# Patient Record
Sex: Male | Born: 1935 | Race: Black or African American | Hispanic: No | Marital: Married | Smoking: Former smoker
Health system: Southern US, Community
[De-identification: ages and names within clinical notes are randomized; demographics above are authoritative.]

## PROBLEM LIST (undated history)

## (undated) DIAGNOSIS — E119 Type 2 diabetes mellitus without complications: Secondary | ICD-10-CM

## (undated) DIAGNOSIS — B351 Tinea unguium: Secondary | ICD-10-CM

## (undated) DIAGNOSIS — I10 Essential (primary) hypertension: Secondary | ICD-10-CM

## (undated) HISTORY — PX: LUNG BIOPSY: SHX232

## (undated) HISTORY — DX: Essential (primary) hypertension: I10

## (undated) HISTORY — DX: Type 2 diabetes mellitus without complications: E11.9

## (undated) HISTORY — PX: PARTIAL HIP ARTHROPLASTY: SHX733

## (undated) HISTORY — DX: Tinea unguium: B35.1

---

## 1999-07-04 ENCOUNTER — Encounter: Admission: RE | Admit: 1999-07-04 | Discharge: 1999-10-02 | Payer: Self-pay | Admitting: Internal Medicine

## 2000-07-15 ENCOUNTER — Encounter (INDEPENDENT_AMBULATORY_CARE_PROVIDER_SITE_OTHER): Payer: Self-pay | Admitting: *Deleted

## 2000-07-15 ENCOUNTER — Ambulatory Visit (HOSPITAL_COMMUNITY): Admission: RE | Admit: 2000-07-15 | Discharge: 2000-07-15 | Payer: Self-pay | Admitting: Gastroenterology

## 2000-10-08 ENCOUNTER — Encounter: Payer: Self-pay | Admitting: Ophthalmology

## 2000-10-13 ENCOUNTER — Ambulatory Visit (HOSPITAL_COMMUNITY): Admission: RE | Admit: 2000-10-13 | Discharge: 2000-10-13 | Payer: Self-pay | Admitting: Ophthalmology

## 2000-10-15 ENCOUNTER — Ambulatory Visit (HOSPITAL_COMMUNITY): Admission: RE | Admit: 2000-10-15 | Discharge: 2000-10-15 | Payer: Self-pay | Admitting: Internal Medicine

## 2000-10-15 ENCOUNTER — Encounter: Payer: Self-pay | Admitting: Internal Medicine

## 2001-06-23 ENCOUNTER — Encounter: Payer: Self-pay | Admitting: Internal Medicine

## 2001-06-23 ENCOUNTER — Ambulatory Visit (HOSPITAL_COMMUNITY): Admission: RE | Admit: 2001-06-23 | Discharge: 2001-06-23 | Payer: Self-pay | Admitting: Internal Medicine

## 2001-07-13 ENCOUNTER — Encounter: Payer: Self-pay | Admitting: Thoracic Surgery

## 2001-07-13 ENCOUNTER — Encounter (INDEPENDENT_AMBULATORY_CARE_PROVIDER_SITE_OTHER): Payer: Self-pay | Admitting: Specialist

## 2001-07-13 ENCOUNTER — Inpatient Hospital Stay (HOSPITAL_COMMUNITY): Admission: RE | Admit: 2001-07-13 | Discharge: 2001-07-17 | Payer: Self-pay | Admitting: Thoracic Surgery

## 2001-07-14 ENCOUNTER — Encounter: Payer: Self-pay | Admitting: Thoracic Surgery

## 2001-07-15 ENCOUNTER — Encounter: Payer: Self-pay | Admitting: Thoracic Surgery

## 2001-07-16 ENCOUNTER — Encounter: Payer: Self-pay | Admitting: Thoracic Surgery

## 2001-07-17 ENCOUNTER — Encounter: Payer: Self-pay | Admitting: Thoracic Surgery

## 2001-07-22 ENCOUNTER — Encounter: Admission: RE | Admit: 2001-07-22 | Discharge: 2001-07-22 | Payer: Self-pay | Admitting: Thoracic Surgery

## 2001-07-22 ENCOUNTER — Encounter: Payer: Self-pay | Admitting: Thoracic Surgery

## 2001-08-24 ENCOUNTER — Encounter: Admission: RE | Admit: 2001-08-24 | Discharge: 2001-08-24 | Payer: Self-pay | Admitting: Thoracic Surgery

## 2001-08-24 ENCOUNTER — Encounter: Payer: Self-pay | Admitting: Thoracic Surgery

## 2001-10-05 ENCOUNTER — Encounter: Payer: Self-pay | Admitting: Thoracic Surgery

## 2001-10-05 ENCOUNTER — Encounter: Admission: RE | Admit: 2001-10-05 | Discharge: 2001-10-05 | Payer: Self-pay | Admitting: Thoracic Surgery

## 2001-12-06 ENCOUNTER — Encounter: Admission: RE | Admit: 2001-12-06 | Discharge: 2001-12-06 | Payer: Self-pay | Admitting: Thoracic Surgery

## 2001-12-06 ENCOUNTER — Encounter: Payer: Self-pay | Admitting: Thoracic Surgery

## 2002-03-07 ENCOUNTER — Encounter: Payer: Self-pay | Admitting: Thoracic Surgery

## 2002-03-07 ENCOUNTER — Encounter: Admission: RE | Admit: 2002-03-07 | Discharge: 2002-03-07 | Payer: Self-pay | Admitting: Thoracic Surgery

## 2002-07-06 ENCOUNTER — Encounter: Payer: Self-pay | Admitting: Thoracic Surgery

## 2002-07-06 ENCOUNTER — Encounter: Admission: RE | Admit: 2002-07-06 | Discharge: 2002-07-06 | Payer: Self-pay | Admitting: Thoracic Surgery

## 2002-10-10 ENCOUNTER — Encounter: Admission: RE | Admit: 2002-10-10 | Discharge: 2002-10-10 | Payer: Self-pay | Admitting: Thoracic Surgery

## 2002-10-10 ENCOUNTER — Encounter: Payer: Self-pay | Admitting: Thoracic Surgery

## 2003-04-17 ENCOUNTER — Encounter: Admission: RE | Admit: 2003-04-17 | Discharge: 2003-04-17 | Payer: Self-pay | Admitting: Thoracic Surgery

## 2003-10-17 ENCOUNTER — Encounter: Admission: RE | Admit: 2003-10-17 | Discharge: 2003-10-17 | Payer: Self-pay | Admitting: Thoracic Surgery

## 2004-05-06 ENCOUNTER — Encounter: Admission: RE | Admit: 2004-05-06 | Discharge: 2004-05-06 | Payer: Self-pay | Admitting: Thoracic Surgery

## 2004-12-12 ENCOUNTER — Encounter: Admission: RE | Admit: 2004-12-12 | Discharge: 2004-12-12 | Payer: Self-pay | Admitting: Thoracic Surgery

## 2005-05-13 ENCOUNTER — Ambulatory Visit (HOSPITAL_COMMUNITY): Admission: RE | Admit: 2005-05-13 | Discharge: 2005-05-13 | Payer: Self-pay | Admitting: Gastroenterology

## 2005-05-13 ENCOUNTER — Encounter (INDEPENDENT_AMBULATORY_CARE_PROVIDER_SITE_OTHER): Payer: Self-pay | Admitting: *Deleted

## 2005-06-24 ENCOUNTER — Encounter: Admission: RE | Admit: 2005-06-24 | Discharge: 2005-06-24 | Payer: Self-pay | Admitting: Thoracic Surgery

## 2006-01-06 ENCOUNTER — Encounter: Admission: RE | Admit: 2006-01-06 | Discharge: 2006-01-06 | Payer: Self-pay | Admitting: Thoracic Surgery

## 2007-12-20 ENCOUNTER — Inpatient Hospital Stay (HOSPITAL_COMMUNITY): Admission: RE | Admit: 2007-12-20 | Discharge: 2008-01-02 | Payer: Self-pay | Admitting: Orthopedic Surgery

## 2007-12-20 ENCOUNTER — Ambulatory Visit: Payer: Self-pay | Admitting: Pulmonary Disease

## 2009-11-08 IMAGING — CR DG HIP OPERATIVE*L*
1 series · 1 of 1 positions shown · non-contrast
Comparison: None

CLINICAL DATA: Left total hip arthroplasty.

OPERATIVE LEFT HIP

[view not recorded]
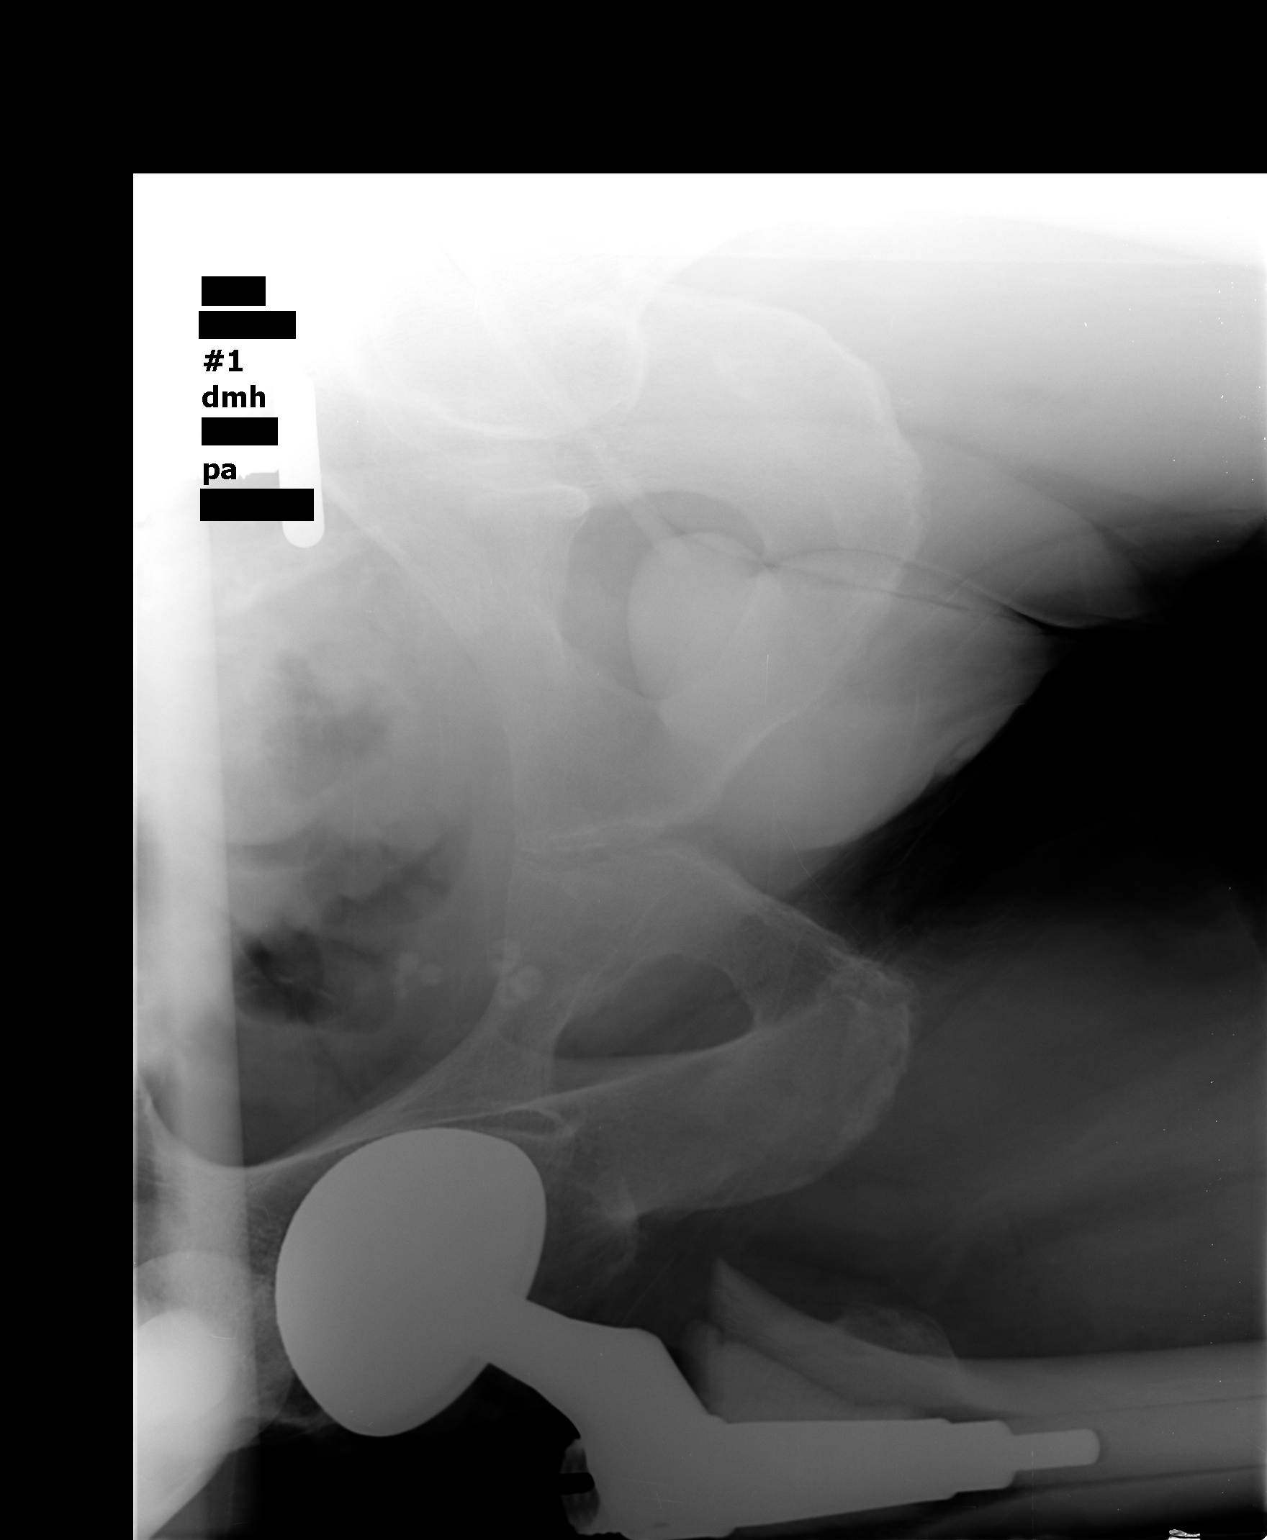

[1 of 1 positions shown; findings below may reference images not displayed]

FINDINGS: Single cross-table PA view of the lower pelvis is
submitted.  Right and left annotations are not included on the
film.  There is a total hip arthroplasty, which is presumably on
the left, given the above history. Lateral portion of the proximal
left femur is not included on the film.
IMPRESSION: Left total hip arthroplasty without immediate complicating feature.

## 2009-11-10 IMAGING — CR DG ABD PORTABLE 1V
1 series · 1 of 1 positions shown · non-contrast
Comparison: None..

CLINICAL DATA: 83-year-old male left hip osteoarthritis, abdominal
distension

ABDOMEN - 1 VIEW

[AP]
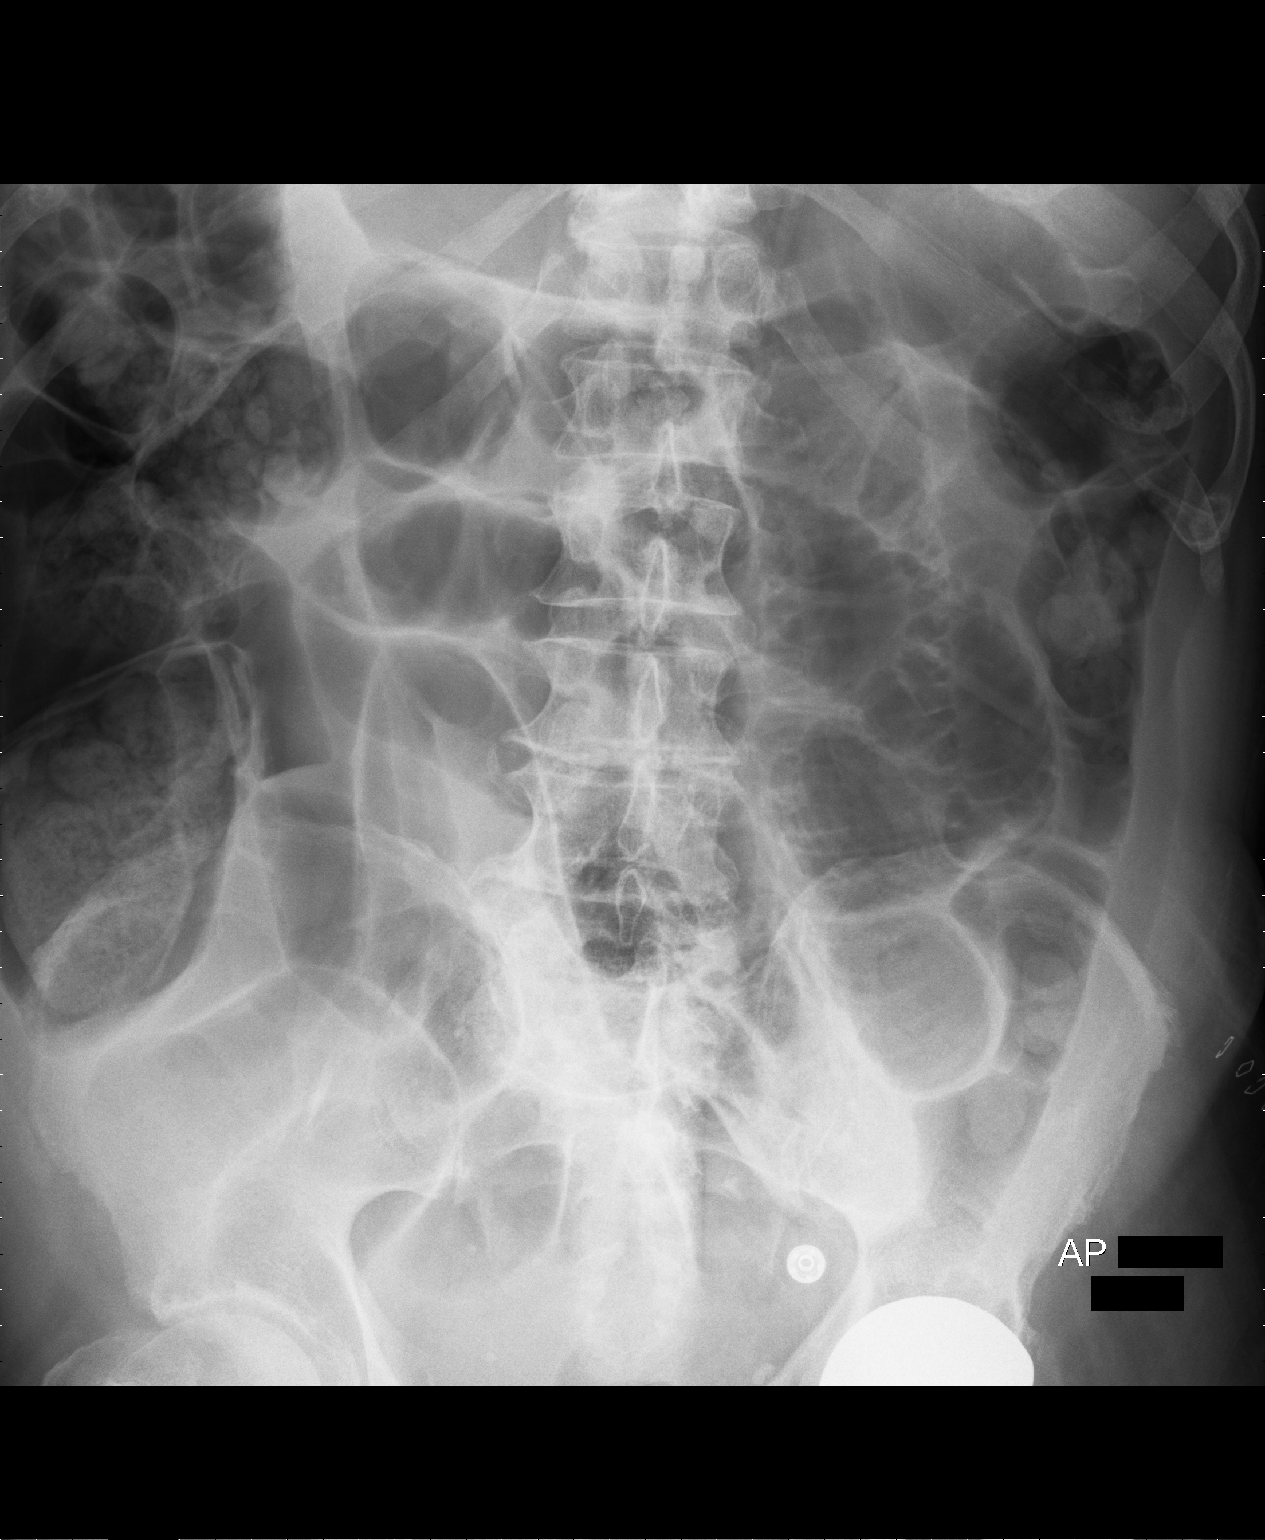

[1 of 1 positions shown; findings below may reference images not displayed]

FINDINGS: There is diffuse gaseous distension of small and large
bowel throughout the entire abdomen consistent with an ileus.  Left
hip arthroplasty is noted.  Pelvic calcifications are likely
vascular]
IMPRESSION: Diffuse gaseous distension of the bowel consistent with an ileus.

## 2009-11-10 IMAGING — CR DG ABD PORTABLE 1V
1 series · 1 of 1 positions shown · non-contrast
Comparison: Single view abdomen of same date.

CLINICAL DATA: Abdominal distension.  Status post left total hip
arthroplasty.

ABDOMEN - 1 VIEW

[view not recorded]
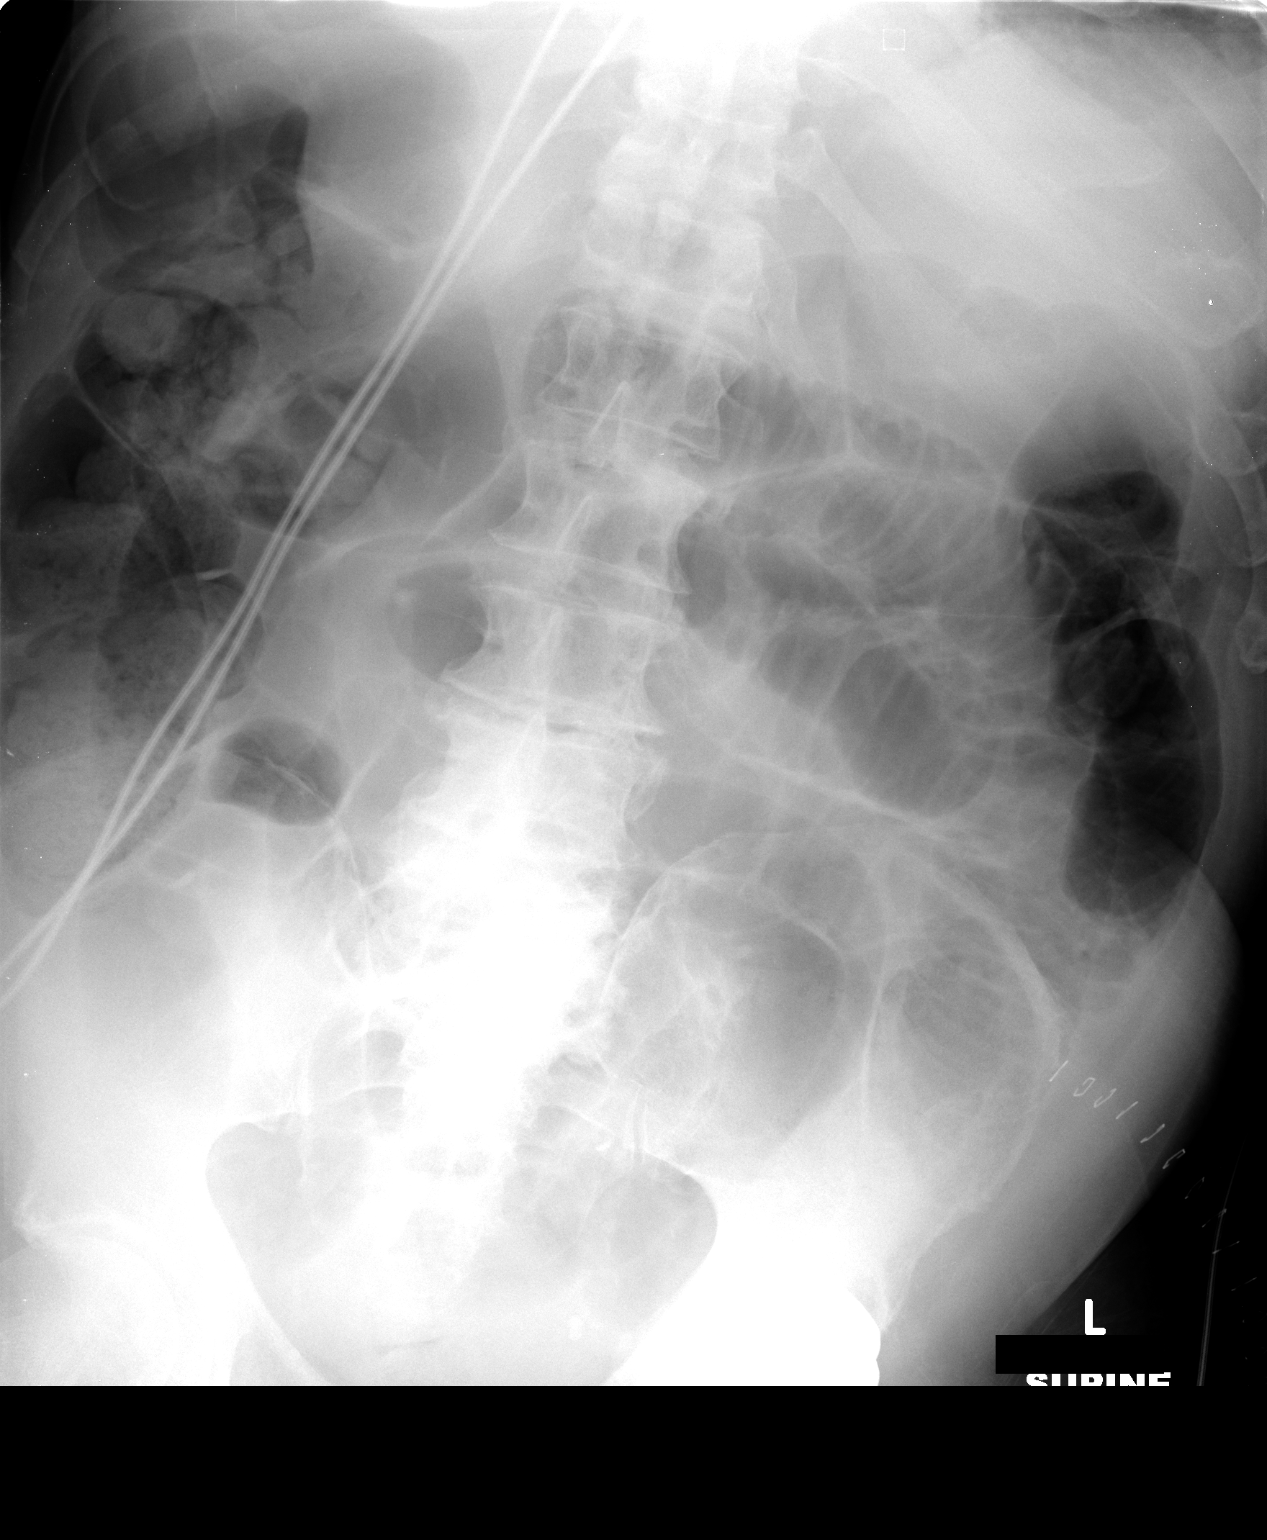

[1 of 1 positions shown; findings below may reference images not displayed]

FINDINGS: Diffuse gaseous distension of the small bowel is
redemonstrated.  There is stool can be seen within the colon.
There is no significant interval change.  Free air is not excluded
on the basis of this film.
IMPRESSION: 1.  Stable appearance of diffuse gaseous distension of small bowel
suggestive of ileus.

## 2009-11-10 IMAGING — US US RENAL
1 series · 13 of 25 positions shown · non-contrast
Comparison: 12/22/2007 conventional radiograph

CLINICAL DATA: Left hip osteoarthritis. Worsening BUN and
creatinine.  Diabetes.  Hypertension.

RENAL/URINARY TRACT ULTRASOUND
TECHNIQUE: Complete ultrasound examination of the urinary tract
was performed including evaluation of the kidneys renal collecting
systems and urinary bladder.

[Series 1: unknown · 0.33mm/px · 13 of 30 slices shown]
[im 1/30]
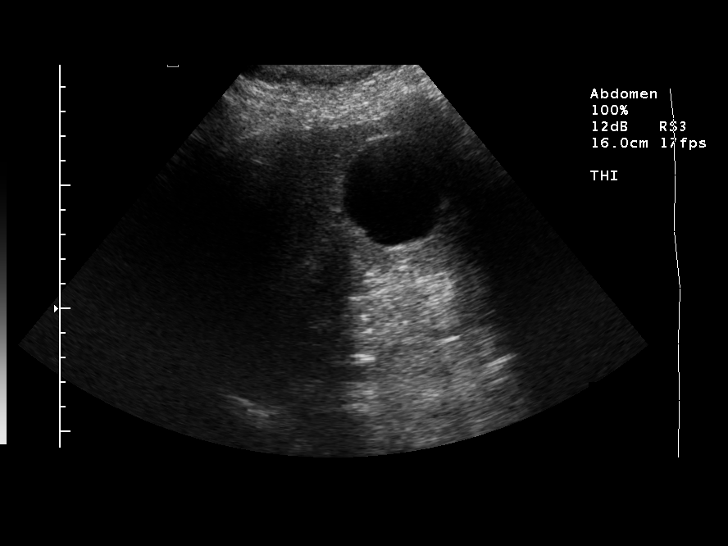
[im 3/30]
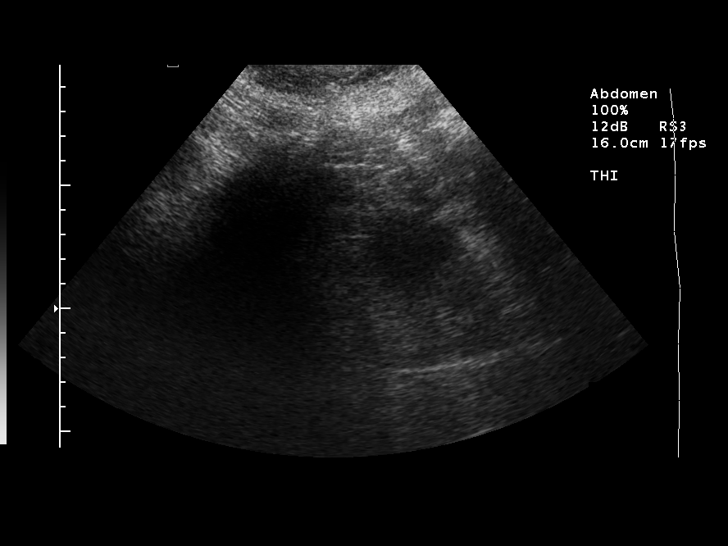
[im 5/30]
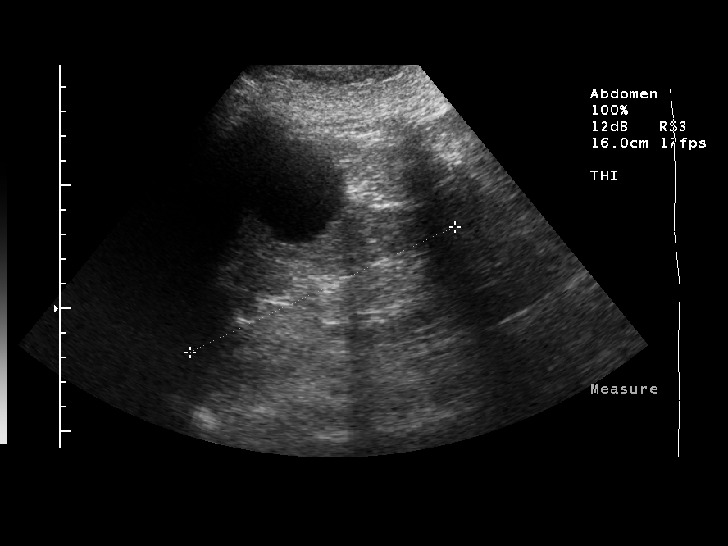
[im 8/30]
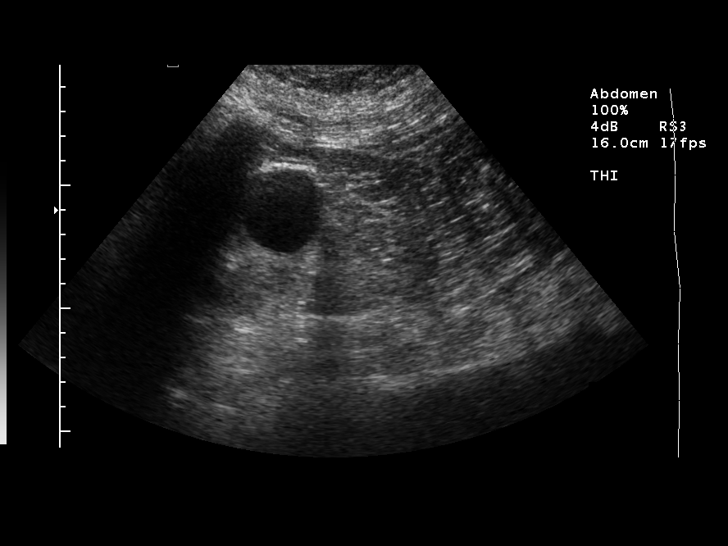
[im 10/30]
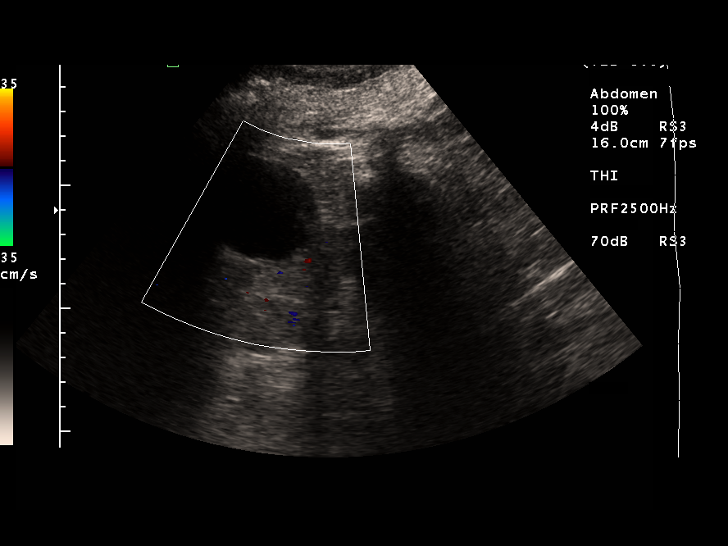
[im 13/30]
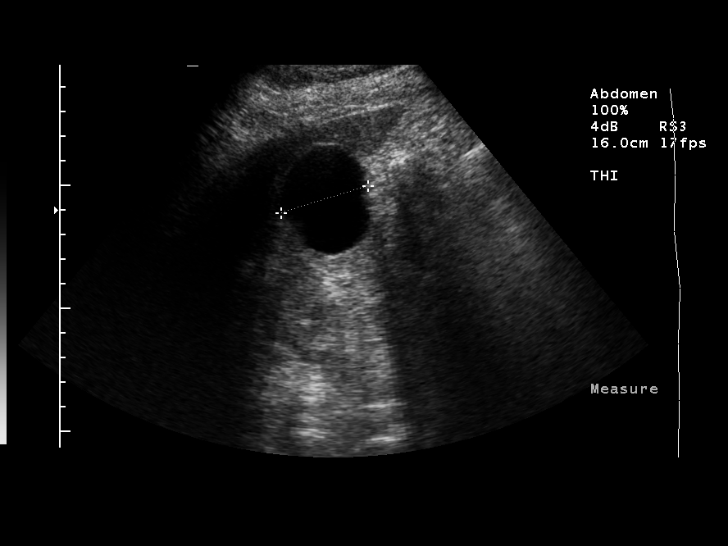
[im 15/30]
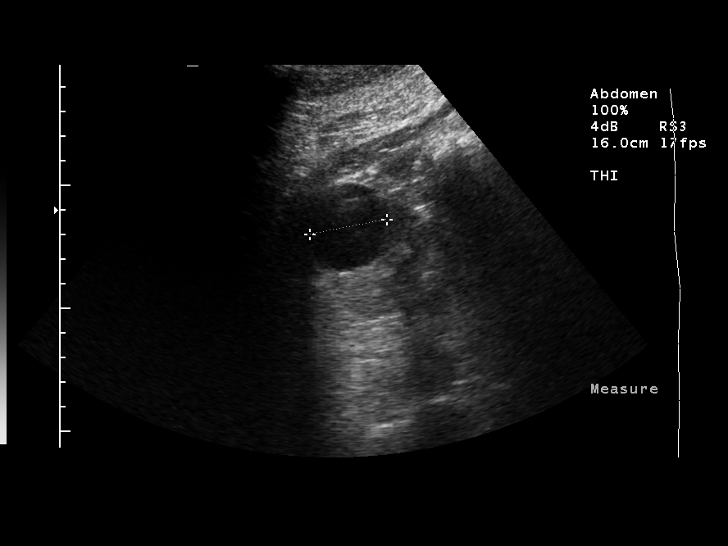
[im 17/30]
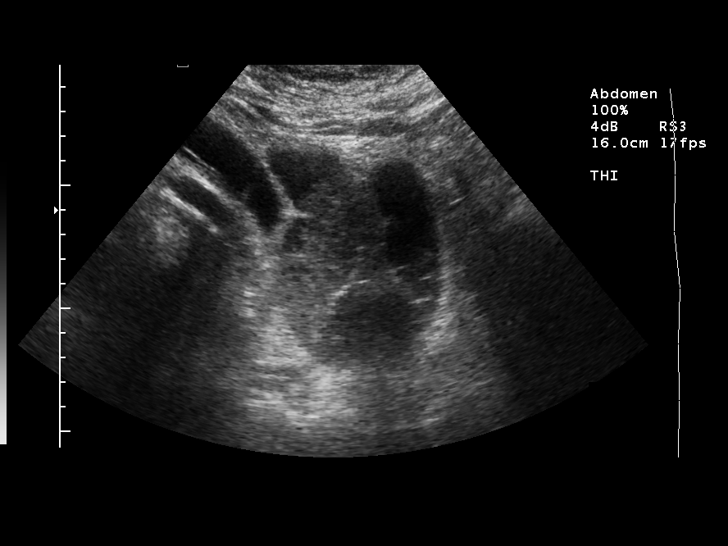
[im 20/30]
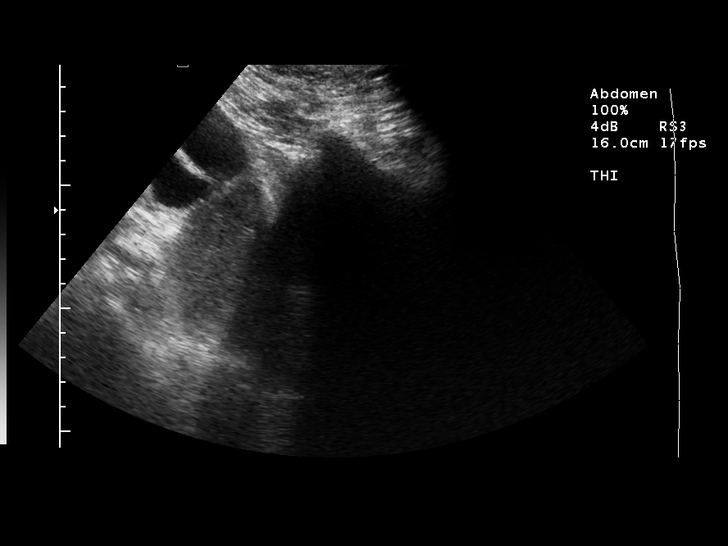
[im 22/30]
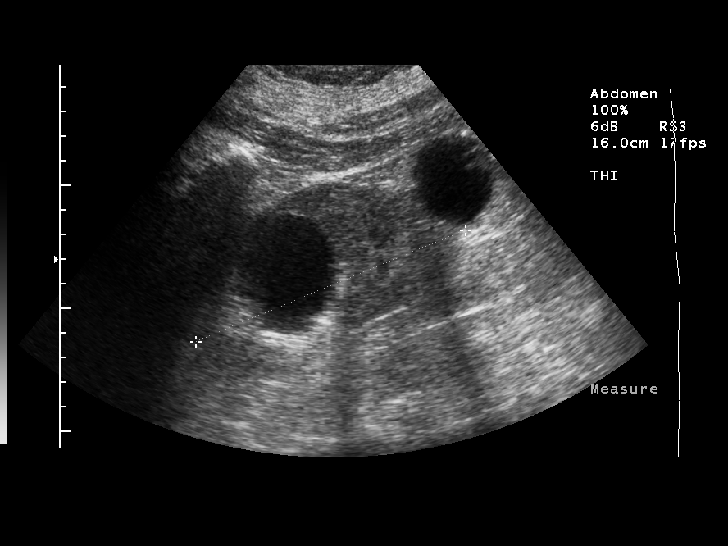
[im 25/30]
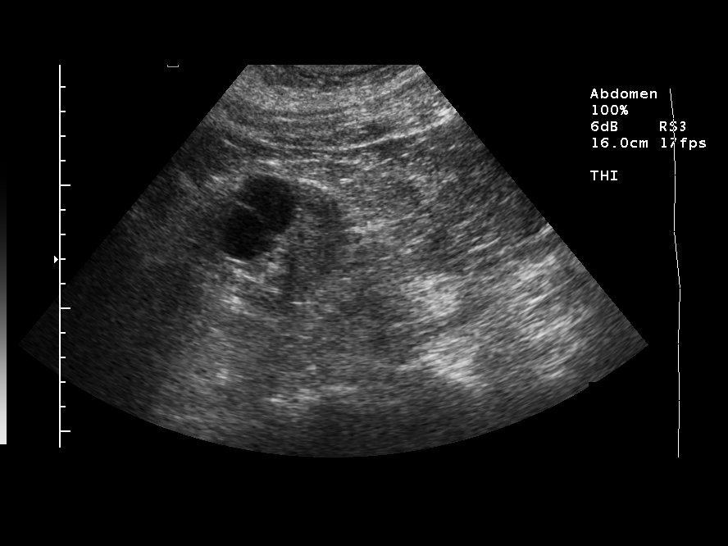
[im 27/30]
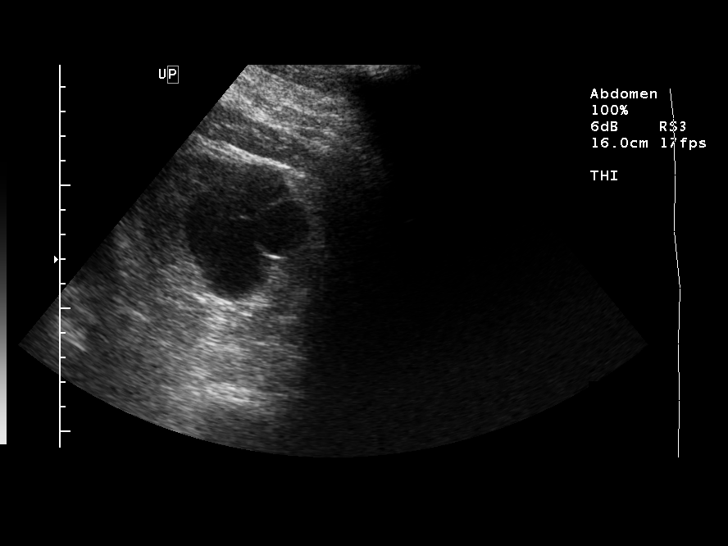
[im 30/30]
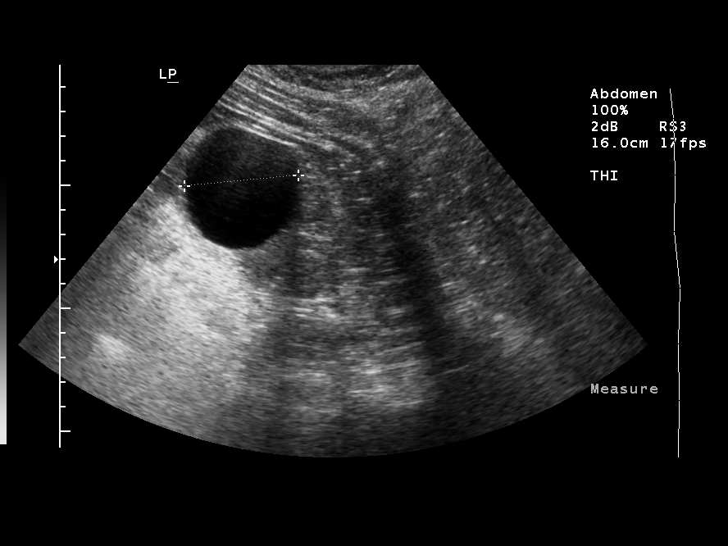

[13 of 25 positions shown; findings below may reference images not displayed]

FINDINGS: Right kidney measures 11.9 cm in greatest length and
contains several renal cysts.  These have enhanced through
transmission and simple imaging characteristics.  One mid kidney
cyst measures 3.6 x 3.6 x 3.2 cm, and an upper pole cyst measures
4.4 x 4.8 x 3.7 cm.  No discrete renal masses identified.

The left kidney measures 11.8 cm in greatest length and also
demonstrates multiple cysts, with the largest measuring 5.0 x 4.6 x
4.6 cm.  This cyst appears to have one to two internal septations
which appears thin sonographically.  On image 21 of today's exam,
there is possible mural nodularity in the left kidney lower pole
cyst, such that further imaging workup is recommended.  Given the
reported renal insufficiency, contrast imaging may be
contraindicated.  This would leave noncontrast CT or MRI as
potential imaging modalities, or alternatively waiting for renal
function to improve prior to contrast imaging.

Renal echogenicity is difficult to assess.  Due to the presence of
considerable bowel gas, images containing the liver and spleen
typically do not also contained the kidneys.  My impression is that
the kidneys are mildly echogenic, possibly reflecting chronic
medical renal disease.

Urinary bladder is not visualized.  There appear to be fluid filled
loops of bowel in the pelvis.
IMPRESSION: 1.  Bilateral renal cysts.  One of the renal cysts on the left side
appears to have some mural nodularity, requiring further workup to
exclude malignancy.  The patient reportedly has increasing
creatinine, and in the setting of renal insufficiency, contrast use
is likely chondral indicated.  Follow-up dynamic MRI after renal
insufficiency resolves would be one option.  Noncontrast CT or MRI,
which would be less specific, is another option.
2.  Nonvisualization the urinary bladder, likely due to small
bladder volume.
3.  The kidneys are thought to be mildly echogenic, although
comparison with the liver and spleen is problematic due to
overlying bowel gas.  The appearance could reflect chronic medical
renal disease.

## 2009-11-11 IMAGING — CR DG CHEST 1V PORT
1 series · 1 of 1 positions shown · non-contrast
Comparison: 12/22/2007.

CLINICAL DATA: Left hip osteoarthritis.  PICC line placement.

PORTABLE CHEST - 1 VIEW

[view not recorded]
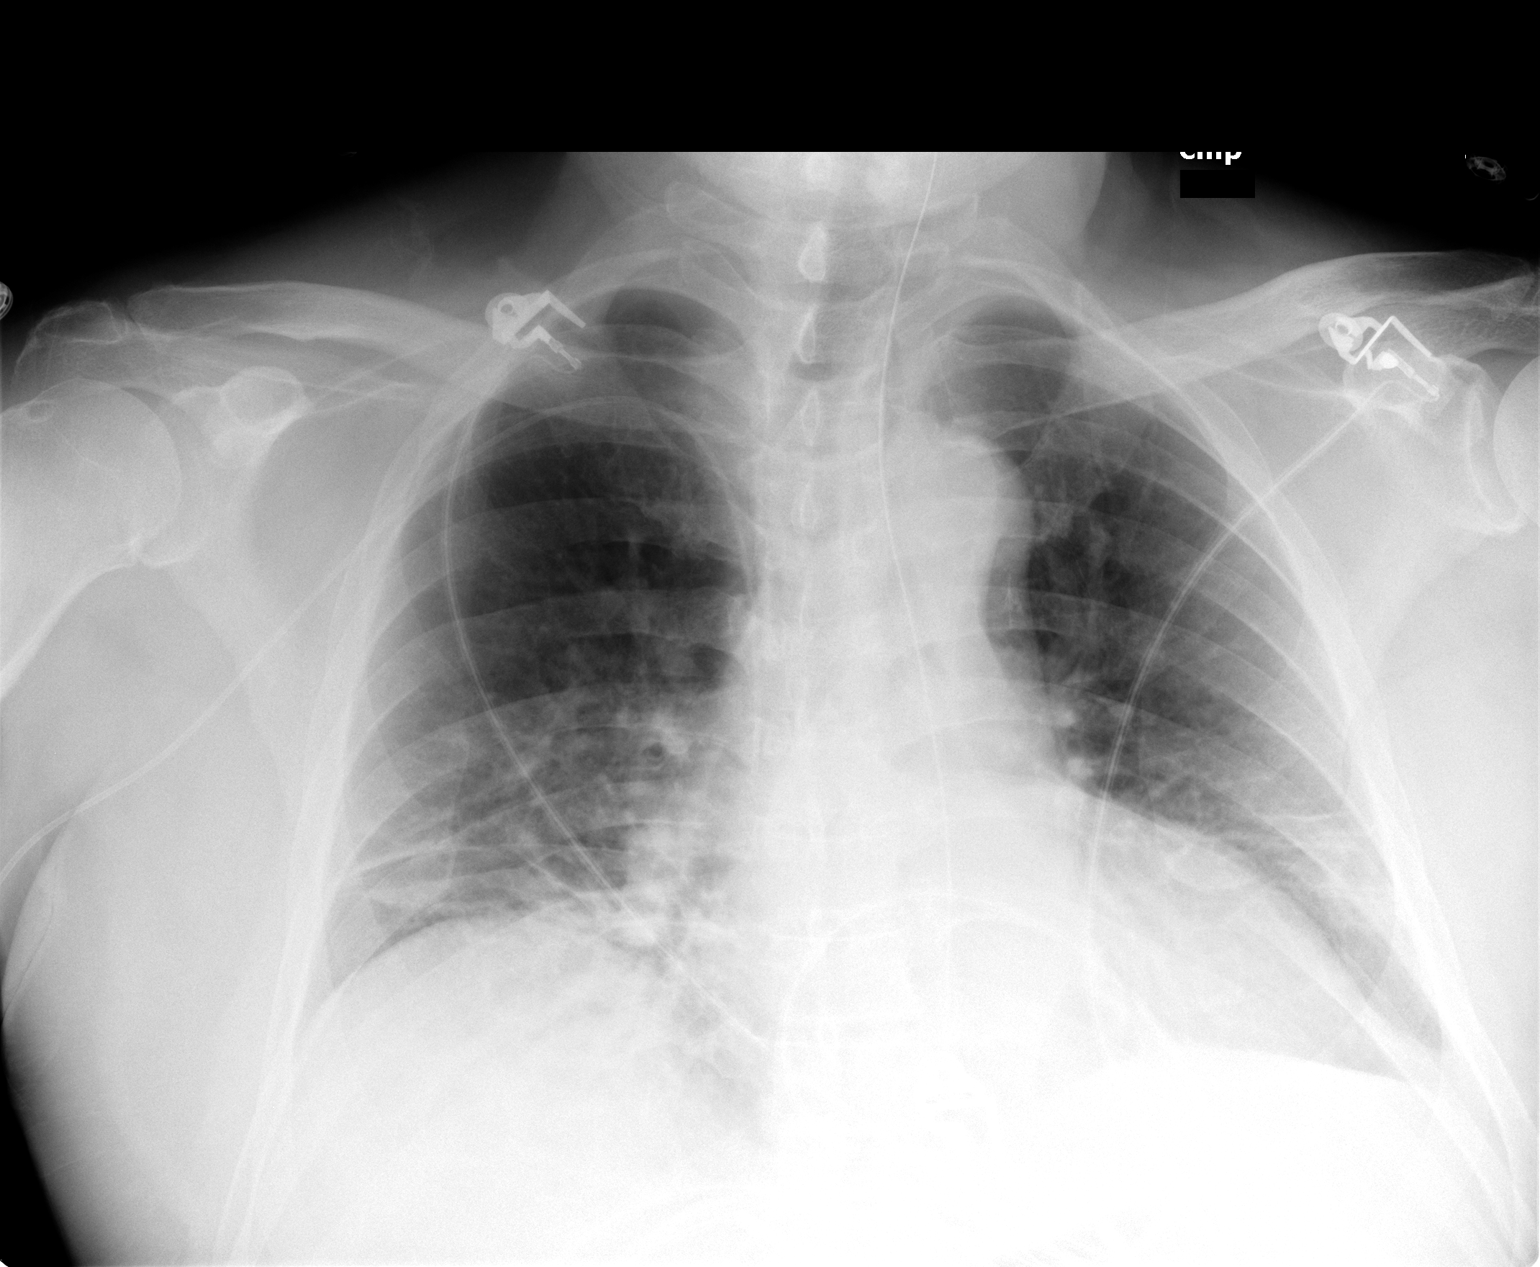

[1 of 1 positions shown; findings below may reference images not displayed]

FINDINGS: 4948 hours.  New right arm PICC projects to the inferior
aspect of the superior vena cava.  Nasogastric tube projects below
the diaphragm.  Bilateral air space opacities appear unchanged.
There is probably a small amount of pleural fluid on the left.  The
heart size and mediastinal contours are stable.  There is no
pneumothorax.
IMPRESSION: 1.  New PICC line tip in the inferior aspect the SVC.
2.  No change in bilateral air space opacities.

## 2010-06-29 ENCOUNTER — Encounter: Payer: Self-pay | Admitting: Thoracic Surgery

## 2010-10-21 NOTE — Op Note (Signed)
NAMEJOHNRYAN, SAO              ACCOUNT NO.:  [id number]   MEDICAL RECORD NO.:  [id number]          PATIENT TYPE:  INP   LOCATION:  2899                         FACILITY:  [facility]   PHYSICIAN:  Burnard Bunting, M.D.    DATE OF BIRTH:  10-Jan-1924   DATE OF PROCEDURE:  12/20/2007  DATE OF DISCHARGE:                               OPERATIVE REPORT   PREOPERATIVE DIAGNOSIS:  Left hip arthritis.   POSTOPERATIVE DIAGNOSIS:  Left hip arthritis.   PROCEDURE:  Left total hip replacement.   SURGEON:  Burnard Bunting, MD   ASSISTANT:  Jerolyn Shin. Lavender, MD   ANESTHESIA:  General endotracheal.   ESTIMATED BLOOD LOSS:  500 mL.   INDICATIONS:  Primus Gritton is an 77 year old patient with left hip  arthritis who presented for operative management of hip arthritis after  failure of conservative management and the explanation of risks and  benefits.   COMPONENTS UTILIZED:  DePuy ASR cup 56, 49 ASR head, S-ROM, 18-B large  sleeve, ASR S-ROM total hip 36 standard neck, plus a lateral 18 x 13 x  160.   PROCEDURE IN DETAIL:  The patient was brought to the operating room  where general endotracheal anesthesia was induced.  Preoperative IV  antibiotics were administered.  The patient was placed in the lateral  decubitus position with the right axilla and right peroneal nerve well  padded.  Time-out was called.  The left hip was prepped with alcohol and  Betadine, allowed air to dry, and then prepped with DuraPrep solution  and then draped in a sterile manner.  Collier Flowers was used to scrub the  operative field.  Posterior approach to the hip was utilized.  Skin and  subcutaneous tissue were sharply divided.  Fascia lata was divided.  The  gluteus maximus muscle fibers were divided in line with their fibers.  Bleeding points were encountered and controlled with electrocautery.  Sciatic nerve was palpated at this time and particularly all times  during the case.  A Charnley retractor was placed.   Piriformis tendon  was tagged and retracted and the external rotators were detached from  the capsule.  The capsule was then split in T-shaped fashion and marked  with a #1 Ethilon.  At this time, the head was dislocated.  A trial head  sleeve stem assembly was then used to mark the precise location for the  femoral neck cut, which was then made with the reciprocating saw.  Lateralizer was then placed to achieve good lateral position in the  femur.  The canal was then reamed up to 13.5 mm.  At this time, the  acetabulum was then reamed in 45 degrees of abduction and 20 degrees of  anteversion.  A 56 Press-Fit cup achieved good rim fit.  Cyst within the  acetabulum were curetted and bone grafted.  A stable cup position was  achieved.  Trial was then placed in 30 degrees of anteversion with easy  impaction.  The hip was stable in full extension and external rotation  and position of sleeve and 90 degrees of hip flexion and 10  degrees of  abduction, 70 degrees of internal rotation.  This was performed with a  +3 and +6 neck.  The +6 neck gave a better stability.  At this time, the  trial femoral components were removed.  True sleeve was placed in level  with the trial sleeve.  At this time, a 13.5 reamer was placed again  back through the sleeve to ream down to the necessary width and depth.  The true prosthesis was then placed, but could not be seated fully.  The  prosthesis was then attempted to be extracted using a slap hammer and  osteotomy wedge technique, but the femur had to be split approximately 4  inches in order to achieve extraction.  Two cables were then carefully  placed with avoidance of the femoral artery and sciatic nerve.  The  cables were hand tightened.  The canal was then re-reamed up to an  additional 0.5 mm.  With the cables in place, the prosthesis was then  placed back in 30 degrees of anteversion with a good fit achieved with  tightening of the cables performed to  achieve an excellent press-fit.  At this time, the ball and stem in the neck were placed with same  stability parameters maintained.  Incision was then thoroughly irrigated  with pulsatile lavage solution.  Sciatic nerve was then palpated and  found be intact.  Incision was then closed over a Hemovac drain by first  closing the capsule using #1 Vicryl suture, then by closing the fascia  lata using interrupted #1 suture followed by interrupted inverted 0  Vicryl suture, 2-0 Vicryl suture, and skin staples.  The patient  tolerated the procedure well without immediate complications.  Unna-Flex  dressing and knee immobilizer were placed.  Leg lengths were equal at  the completion of the case.  Dorsiflexion of the toes was observed.  Dr.  Lenny Pastel assistance was required at all times during the case for  retraction of important neurovascular structures and limb positioning  and his assistance was a medical necessity.      Burnard Bunting, M.D.  Electronically Signed     GSD/MEDQ  D:  12/20/2007  T:  12/21/2007  Job:  [id number]

## 2010-10-21 NOTE — Consult Note (Signed)
NAMEAZARIUS, LAMBSON NO.:  [id number]   MEDICAL RECORD NO.:  [id number]          PATIENT TYPE:  INP   LOCATION:  2115                         FACILITY:  [facility]   PHYSICIAN:  Maree Krabbe, M.D.DATE OF BIRTH:  08-20-1923   DATE OF CONSULTATION:  12/24/2007  DATE OF DISCHARGE:                                 CONSULTATION   HISTORY:  This is an 77 year old African American male with a history of  diabetes, coronary artery disease, and hypertension.  He had DJD of the  left hip and had an elective left total hip replacement on December 20, 2007.  Intraoperative blood loss was about 700 mL.  He had some  postoperative relative hypotension and blood pressure medicines were  held.  He had some nausea and vomiting postoperatively.  He had a preop  creatinine of 1.4, postoperatively increased to 2.3 to 2.8 yesterday and  3.5 today.  The patient currently still has an ileus with an NG tube in  draining about 50 mL every 4 hours.  He is making urine about 50 to 70  mL an hour.  Some initial studies were done including urine sodium,  which was less than 10.  On July 16 a renal ultrasound showed 11.8 and  11.9 cm, slightly echogenic kidneys.   PAST MEDICAL HISTORY:  1. Type 2 diabetes.  2. Hypertension, longstanding.  3. Hyperlipidemia.  4. He does have some prostate disease followed by [facility].  5. Osteoarthritis.   PAST SURGICAL HISTORY:  Removal of benign tissue from left lung in 2003  and bilateral cataract surgery.   HOME MEDICATIONS:  Celebrex, Nexium, Januvia, colchicine, Norvasc 10 a  day, Glucotrol, Benicar 40 once a day, Zetia, aspirin, multivitamin, and  fish oil.   CURRENT MEDICATIONS:  TNA at 45 an hour, sliding scale insulin, IV  Reglan, IV Lopressor, intravenous pantoprazole, piperacillin/tazobactam,  Coumadin, p.r.n. Robaxin, and then Dilaudid.   SOCIAL HISTORY:  Noncontributory, no history of renal disease in the  family.   FAMILY  HISTORY:  Noncontributory.   REVIEW OF SYSTEMS:  Denies fever, chills, sweats, chest pain, and  shortness of breath.  He has a NG tube in place, not eating.  Bowels are  not moving.  He did have some flatus yesterday.  His Foley catheter in  place.  Denies any history of kidney problems in the past.  Has DJD as  noted above.  NEUROLOGIC:  No history of stroke, TIA, or seizure.  No  focal neurologic complaints today.   LABORATORY DATA:  BUN 61, creatinine 3.95, sodium 139, potassium 3.8,  and bicarb 28.  Liver enzymes normal.  Albumin 2.1, calcium 8.3,  phosphorus 4.5, and magnesium 2.6.  Troponin I x3 were negative.  Cholesterol 99.  Chest x-ray, no acute disease.  He has some bibasilar  atelectasis probably related to his distended abdomen.   IMPRESSION:  1. Acute on chronic renal failure due to volume depletion and possible      angiotensin receptor blocker effect.  The patient's Benicar has      been held for a couple of  days, but the creatinine continues to      rise.  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  This would      make him more susceptible to volume shifts and borderline      hypotension.  He is nonoliguric currently and I suspect he will      recover, but it may be a gradual process.  He needs more fluid and      has room for more fluid.  We will replace all his losses every 4      hours with half normal saline and then give him an additional 75 mL      an hour, not including his total nutrient admixture, which is at 40      an hour.  2. Postoperative ileus.  3. Status post left total hip replacement.  4. History of diabetes.  5. Long-standing hypertension.      Maree Krabbe, M.D.  Electronically Signed     RDS/MEDQ  D:  12/24/2007  T:  12/24/2007  Job:  [id number]

## 2010-10-21 NOTE — Consult Note (Signed)
Jose Kane, Jose Kane              ACCOUNT NO.:  [id number]   MEDICAL RECORD NO.:  [id number]          PATIENT TYPE:  INP   LOCATION:  5005                         FACILITY:  [facility]   PHYSICIAN:  Marcellus Scott, MD     DATE OF BIRTH:  [birth year]   DATE OF CONSULTATION:  12/22/2007  DATE OF DISCHARGE:                                 CONSULTATION   PRIMARY MEDICAL DOCTOR:  Candyce Churn. Allyne Gee, M.D.   REQUESTING PHYSICIAN:  Cammy Copa, M.D.   REASON FOR CONSULTATION:  Worsening creatinine.   CHIEF COMPLAINT:  Soreness at the left hip operated site.   HISTORY OF PRESENT ILLNESS:  Jose Kane is a pleasant 77 year old  African American male patient with history of hypertension, type 2  diabetes, hyperlipidemia who underwent total left hip arthroplasty on  December 20, 2007.  Intraoperatively, the patient had estimated blood loss  of 700 mL.  On reviewing the records, he had no significant hypotensive  episodes, except briefly towards the end of the procedure where he had  systolic blood pressures in the 90s.  He had good urine output also.  However, on his checking his lab data, it is noted that his creatinine  has jumped up to 2.35 from a preop of 1.4.  We were thereby called in  consult.  Apart from the pain in the left hip, the patient denies any  other complaints.   PAST MEDICAL HISTORY:  1. Type 2 diabetes.  2. Hypertension.  3. Hyperlipidemia.  4. CKD  5. Osteoarthritis.   PAST SURGICAL HISTORY:  1. Left-sided lung surgery for scar tissue which was said to be benign      in 2003.  2. Bilateral cataract surgery with intraocular lens implantation.   ALLERGIES:  MORPHINE.   HOME MEDICATIONS:  1. Celebrex 200 mg p.o. daily p.r.n.  2. Nexium 40 mg p.o. daily.  3. Januvia 100 mg p.o. daily.  4. Colchicine 0.6 mg p.o. p.r.n.  5. Norvasc 10 mg p.o. daily.  6. Glucotrol XL 5 mg p.o. daily.  7. Benicar 40 mg p.o. daily.  8. Zetia 10 mg p.o. daily.  9. Aspirin 81 mg p.o.  daily.  10.Multivitamin one p.o. daily.  11.Fish oil 1200 mg p.o. daily.   CURRENT MEDICATIONS:  1. Norvasc 10 mg daily.  2. Os-Cal 500 mg p.o. t.i.d.  3. Colchicine 0.6 mg p.o. daily.  4. Colace 100 mg p.o. b.i.d.  5. Zetia 10 mg p.o. daily.  6. Glucotrol 5 mg p.o. daily.  7. NovoLog sliding scale insulin.  8. Multivitamins one daily.  9. Benicar which is on hold.  10.Protonix 80 mg p.o. daily.  11.Januvia 100 mg p.o. daily.  12.Coumadin per pharmacy.  13.Multiple p.r.n. medications.   FAMILY HISTORY:  Significant for diabetes, hypertension and heart  disease.   SOCIAL HISTORY:  The patient is married for 61 years and lives with his  wife and is independent of activities of daily living.  He used to smoke  a pack of cigarettes per day, which he quit 25 years ago.  There is no  history of alcohol  or drug abuse.   REVIEW OF SYSTEMS:  There were 14 systems reviewed and apart from  history of presenting illness, is noncontributory.  There is no history  of chest pain, palpitations, dyspnea, weakness, dizziness, lethargy.  The patient denies dysuria, frequency, difficulty passing urine.   PHYSICAL EXAMINATION:  GENERAL APPEARANCE:  Jose Kane is a moderately  built and nourished gentleman who is sitting in a chair in no obvious  distress.  VITAL SIGNS:  Temperature 98.7, pulse 118 per minute and regular,  respirations 18 per minute, blood pressure of 109/65.  Saturating 94% on  2 liters of oxygen.  CBGs 163/151/149.  HEENT:  Nontraumatic normocephalic.  Left pupil is irregular but both  pupils are reacting to light and accommodation.  Oral cavity is with  slightly dry mucous membranes.  Area of ecchymosis which is submucosal  on the right side posterior pharyngeal wall, but no overt bleeding  exteriorly.  NECK:  Supple.  No JVD or carotid bruit.  LYMPHATICS:  No lymphadenopathy.  RESPIRATORY:  Clear to auscultation.  CARDIOVASCULAR:  First and second heart sounds heard.   Regular  tachycardic.  No third or fourth heart sounds or murmurs.  ABDOMEN:  Obese.  Nontender.  No organomegaly or mass appreciated.  No  bruit or hum appreciated.  Bowel sounds are normally heard.  CENTRAL NERVOUS SYSTEM:  The patient is awake, alert, and oriented x3.  No focal neurological deficits.  EXTREMITIES:  With no cyanosis, clubbing or edema.  Left lower extremity  is in a sling.  The operated site dressing is clean, dry and intact.  The patient is able to wiggle the toes on the left lower extremity.  There is no cyanosis, clubbing or edema.  Peripheral pulses are  symmetrically felt.  SKIN:  Without any rashes.  MUSCULOSKELETAL:  Unremarkable.   LABORATORY DATA:  Basic metabolic panel remarkable for BUN of 27,  creatinine of 2.35.  Creatinine on admission was 1.4.  INR today is 3.1.  CBC is with hemoglobin 8.3, hematocrit 25, white blood cell 9.9,  platelets 143.  Urine culture 1000 colonies/mL, insignificant growth.  Urinalysis not suggestive of UTI, had 30 mg/dL of protein.   RADIOLOGY:  Portable left hip x-ray.  Impression:  Good position  alignment following left total hip revision.  X-ray of the left hip.  Impression:  Left total hip arthroplasty without  immediate complicating features.  Chest x-ray on December 16, 2007:  Stable exam.  No acute cardiopulmonary  process.   EKG on December 16, 2007:  Sinus rhythm with first degree AV block with PR  interval of 250 milliseconds.  Left axis deviation.  Questionable Q-  waves in inferior and in septal leads, but no acute changes.   ASSESSMENT AND PLAN:  1. Acute on chronic kidney disease.  This is probably secondary to      relative renal hypoperfusion from acute blood loss anemia,      dehydration, low normal blood pressures perioperatively.  Will rule      out obstructive uropathy.  Will obtain renal ultrasound.  I spoke      with Dr. Allyne Gee on the phone, who suggested that the patient had a      baseline creatinine of  1.42 on the August 15, 2007, and 1.41 in      November 2008.  He was referred to the urologist, but unclear if      the patient has seen the urologist.  His antihypertensive  medications have been held today, which is appropriate as long as      his blood pressure remains normal.  Will briefly hydrate with IV      fluids and follow patient's basic metabolic panel in the morning.      I do expect his renal functions to return to his baseline with      these measures.  Patient was on Celebrex p.r.n. at home.  To avoid      all nonsteroidal anti-inflammatory drugs at this time.  2. Acute blood loss anemia.  Status post 1 unit of packed red blood      cell transfusion.  Would recommend transfusion of an additional 1-2      units of packed red blood cells.  3. Mild thrombocytopenia.  To monitor CBCs.  4. Hypertension.  Agree with holding his antihypertensive medications,      especially his ARBs temporarily as long as his blood pressure is      within normal limits.  5. Type 2 diabetes.  Will reduce dose of Januvia temporarily and      continue his other medications and sliding scale insulin.  6. Coagulopathy secondary to Coumadin.  No active bleeding.  Pharmacy      is monitoring his INR and Coumadin.  7. Dyslipidemia - continue Zetia.  8. Gastrointestinal prophylaxis.  9. Sinus tachycardia secondary to pain and anemia.  Will check an EKG.   Thank you for this consult and we will follow along with you.      Marcellus Scott, MD  Electronically Signed     AH/MEDQ  D:  12/22/2007  T:  12/22/2007  Job:  [id number]   cc:   Candyce Churn. Allyne Gee, M.D.  Burnard Bunting, M.D.

## 2010-10-21 NOTE — Consult Note (Signed)
NAMETETSUO, COPPOLA NO.:  [id number]   MEDICAL RECORD NO.:  [id number]          PATIENT TYPE:  INP   LOCATION:  2115                         FACILITY:  [facility]   PHYSICIAN:  Cherylynn Ridges, M.D.    DATE OF BIRTH:  December 02, 1923   DATE OF CONSULTATION:  12/23/2007  DATE OF DISCHARGE:                                 CONSULTATION   Dear Dr. August Saucer:   Thank you very much for asking me to see [name], an 77 year old,  otherwise healthy with the exception of non-insulin-dependent diabetes,  coronary artery disease, and hypertension, status post left total hip  replacement 3 days ago who now has a very significant adynamic ileus  and/or bowel obstruction.   By the patient's report, his problems with his abdomen started the day  after surgery where he noted that he began to get more and more bloated.  This persisted to where he eventually got admitted to the ICU yesterday  for possible aspiration pneumonia and from significant emesis related to  his adynamic ileus.  X-rays demonstrate diffuse small bowel and large  bowel distention with no actual obstruction.  A CT scan has not been  done.  He has also noted problems with worsening of his renal function.  He was brought to unit for more intense observation and management of  possible aspiration pneumonia and NG tube decompression.   PAST MEDICAL HISTORY:  Significant for non-insulin-dependent diabetes,  hypertension, and coronary artery disease.   MEDICATIONS:  He is on multiple medications including Glucotrol,  Norvasc, aspirin, Januvia, Celebrex, colchicine, Zetia, Benicar, Nexium,  and fish oil.   ALLERGIES:  He is allergic to MORPHINE.   PAST SURGICAL HISTORY:  He has had no abdominal surgeries before.  He  has had lung surgery.   PHYSICAL EXAMINATION:  VITAL SIGNS:  His pulse is 107 down from the 120s-  130s, blood pressure 127/54, and temperature is 98.9.  LUNGS:  Coarse on the right side.  Diminished in  the bases.  No wheezes  or rales.  Chest x-ray shows a right lower lobe infiltrate.  ABDOMEN:  Markedly distended with absent bowel sounds.  He is nontender.  Does not have any pain.  I was able to retrieve approximately 750 mL of  dark burgundy aspirate from his NG tube after readjusting the one he had  in and then also replacing it with an 18-French NG tube.   Chest x-ray is pending.   LABORATORY DATA:  Hemoglobin 9.7.  BUN 38, creatinine 2.8, and glucose  147.  Blood gas on this morning 7.43, 36, 59, and 23.   IMPRESSION:  Adynamic ileus secondary to possible fluid change from  surgery.  He had an estimated blood loss of 700 mL from surgery, though  I do not see any evidence that the patient had massive blood loss.  I do  see one transfusion sheet on his chart where he received 1 unit of  packed blood cells.   PLAN:  Currently to decompress him with an adequate NG tube, replace his  volume, and also replace his nutritional substances  using total  parenteral nutrition or TNA along with IV antibiotics for his recently  diagnosed possible aspiration pneumonia.  We will continue to follow the  patient.      Cherylynn Ridges, M.D.  Electronically Signed     JOW/MEDQ  D:  12/23/2007  T:  12/23/2007  Job:  [id number]   cc:   G. Dorene Grebe, M.D.

## 2010-10-21 NOTE — Consult Note (Signed)
NAMENOLIN, GRELL NO.:  [id number]   MEDICAL RECORD NO.:  [id number]          PATIENT TYPE:  INP   LOCATION:  2629                         FACILITY:  [facility]   PHYSICIAN:  Felipa Evener, MD  DATE OF BIRTH:  1923-09-11   DATE OF CONSULTATION:  DATE OF DISCHARGE:                                 CONSULTATION   HISTORY:  The patient is an 77 year old male patient whose history is  significant for arthritis resulting in multiple surgeries who presented  to the hospital on December 20, 2007, with a chief complaint of left hip  arthritis and for left total hip replacement was elected to be done by  Dr. Dorene Grebe.  The patient was placed under general anesthesia,  endotracheal intubation, and was operated upon and was shortly after  extubated and placed in stepdown unit at 2600.  Internal Medicine was  consulted for medical management of the patient's diabetes and while  being followed by Internal Medicine, it was noted that the patient's was  having some stridor and the hospitalist called pulmonary critical care  for evaluation of his upper airway sounds.  Upon interviewing the  patient, the patient seems that he has had the upper airway sounds for  the first time in 2003 after a cervical spine surgery and after the  operation he has done well and was discharged from the hospital and  every time he has had any surgical intervention after that, he has  developed these upper airway noises in the middle of his  hospitalization.  He reports that there is no instigating or relieving  factor.  However, benzodiazepines and other psychotropic medications  seemed to worsen the patient's symptoms.  The symptoms are described as  upper airway choking sounds emanating from the patient's neck and  feels like he is taking his last breath not accompanied by any cough,  fever, chills, nausea, vomiting, abdominal pain, or chest pain.  He does  not complain of any sputum production,  definitely no evidence of  hemoptysis, and no evidence of instability of his vital signs during  those periods.   PAST MEDICAL HISTORY:  Significant for type 2 diabetes, systemic  hypertension, hyperlipidemia, and osteoarthritis.   PAST SURGICAL HISTORY:  Left-sided lung surgery, a scar tissue that was  found to be benign in 2003 and bilateral cataract surgery in 2003.  Also, had a cervical spine surgery.   ALLERGIES:  MORPHINE.   MEDICATIONS AT HOME:  Celebrex, Nexium, Januvia, colchicine, Norvasc,  Glucotrol, Benicar, Zetia, aspirin, multivitamin, and fish oil.   CURRENT MEDICATIONS IN THE HOSPITAL:  TPN, clonidine, Ativan,  metoclopramide, metoprolol, pantoprazole, Zosyn, warfarin, Thorazine,  hydromorphone, and Zofran.   FAMILY HISTORY:  Noncontributory from the current standpoint.   SOCIAL HISTORY:  He used to smoke, quit 25 years ago with no residual  pulmonary disease.  No inhalers had to be used and no oxygen at home.  No significant occupational history.  No significant drug use history.  A 12-point review of system was performed and was negative as mentioned  above.   PHYSICAL EXAMINATION:  GENERAL:  A well-appearing elderly male sitting  comfortably in bed, in no acute distress.  VITAL SINGS:  Temperature 97.9, heart rate 95, respiratory rate is 18,  blood pressure 124/54, and saturation 97% on room air.  HEENT:  Normocephalic and atraumatic.  Pupils are equal, round, and  reactive to light.  Extraocular movements are intact.  Oral nasal mucosa  within normal limits.  No light reflex appreciated.  NECK:  No thyromegaly.  Neck veins flat.  HEART:  Regular rate and rhythm, S1 and S2.  No murmurs, rubs, or  gallops appreciated.  LUNGS:  Decreased sounds at both bases.  ABDOMEN:  Soft, it is diffusely tender and distended with hypoactive  bowel sounds.  EXTREMITIES:  Edema 1+ bilaterally.  No tenderness.  NEUROLOGIC:  Grossly intact.  The patient is alert and oriented  x3 and  is able to relay his symptoms very eloquently.   LABORATORY DATA:  Phosphate is 1.6.  BMP with a sodium of 139, potassium  of 3.5, chloride of 106, CO2 of 25, glucose of 100, BUN of 13,  creatinine of 1.23, and a calcium of 7.9.  INR is 1.3.  Hemoglobin was  16.8 on presentation and deteriorated requiring further transfusion and  has been followed by the Internal Medicine team.   Chest x-ray were reviewed and revealed no major abnormalities.   ASSESSMENT AND PLAN:  The patient is an 77 year old male with past  medical history significant for multiple surgeries and interestingly he  had been having upper airway obstructive noises every time he is  admitted to the hospital for surgical intervention.  While I am unable  to exactly pinpoint the reason for vocal cord spasm, as this is  certainly a possibility especially that the symptom seemed to resolve  with pursed-lip breathing on physical examination.  Therefore, at this  point we would recommend discontinuation of any sedatives as well as any  narcotics, as the patient does have a history of morphine and that seems  to be the only common medication between now and previous medical  intervention.  We would also recommend discontinuation of Ativan as well  as Thorazine and Robaxin, also can be the culprits in vocal cord spasm.  I have shown the patient exercise in order to control the vocal cord  spasm that can occur in situation of anxiety.  However, I am unsure if  the patient will be able to follow the instruction, as he at night tends  to have some sundowning symptoms.  The family is at bedside and all were  updated and expressed understanding of the situation and if the symptoms  persist, we would recommend involvement of speech therapy for further  training for the patient to control his vocal cord spasm.      Felipa Evener, MD  Electronically Signed     WJY/MEDQ  D:  12/27/2007  T:  12/28/2007  Job:  [id number]

## 2010-10-24 NOTE — Op Note (Signed)
Jose Kane, Jose Kane              ACCOUNT NO.:  [id number]   MEDICAL RECORD NO.:  [id number]          PATIENT TYPE:  AMB   LOCATION:  ENDO                         FACILITY:  [facility]   PHYSICIAN:  Anselmo Rod, M.D.  DATE OF BIRTH:  1924-01-03   DATE OF PROCEDURE:  05/13/2005  DATE OF DISCHARGE:                                 OPERATIVE REPORT   PROCEDURE PERFORMED:  Colonoscopy with snare polypectomy x 2.   ENDOSCOPIST:  Charna Elizabeth, M.D.   INSTRUMENT USED:  Olympus video colonoscope.   INDICATIONS FOR PROCEDURE:  77 year old African American male with a history  of guaiac positive stool to rule out colonic polyps, masses, etc.   PREPROCEDURE PREPARATION:  Informed consent was obtained from the patient.  The patient was fasted for four hours prior to the procedure after being  prepped with Osmo prep the night of and the morning of the procedure.  The  risks and benefits of the procedure including a 10% miss rate of cancer and  polyps were discussed with the patient, as well.  The patient had has  adenomatous polyps removed in the past and, therefore, colonoscopy is being  done to rule out recurrent polyps.   PREPROCEDURE PHYSICAL:  Patient with stable vital signs.  Neck supple.  Chest clear to auscultation.  S1 and S2 regular.  Abdomen soft with normal  bowel sounds.   DESCRIPTION OF PROCEDURE:  The patient was placed in the left lateral  decubitus position, sedated with 75 mcg of Fentanyl and 7 mg Versed in slow  incremental doses.  Once the patient was adequately sedated, maintained on  low flow oxygen and continuous cardiac monitoring, the Olympus video  colonoscope was advanced from the rectum to the cecum with difficulty.  There was a large amount of residual stool in the colon, multiple washes  were done, visualization was poor.  There was evidence of pandiverticulosis  with two small sessile polyps noted at 120 cm, these were removed via hot  snare.  The appendiceal  orifice and ileocecal valve were clearly visualized  and photographed after changing the patient's position from the left lateral  to the supine position, and moving the stool pool.  The patient tolerated  the procedure well without immediate complications.   IMPRESSION:  1.Pandiverticulosis.  2.Two polyps removed via hot snare from 120 cm.  3.Large amount of residual stool in the colon, multiple washes done, small  lesions could be missed.   RECOMMENDATIONS:  1.Await pathology results.  2.Avoid nonsteroidals including aspirin for the next two weeks.  3.High fiber diet with liberal fluid intake, brochures on diverticulosis  have been given to the patient for his education.  4.Outpatient follow up as need arises in the future.      Anselmo Rod, M.D.  Electronically Signed     JNM/MEDQ  D:  05/13/2005  T:  05/13/2005  Job:  [id number]   cc:   Candyce Churn. Allyne Gee, M.D.  Fax: [phone]

## 2010-10-24 NOTE — Procedures (Signed)
[place]  Patient:    Jose Kane, Jose Kane                     MRN: [id number] Proc. Date: 07/15/00 Adm. Date:  [id number] Attending:  Charna Elizabeth CC:         Antony Madura, M.D.   Procedure Report  PROCEDURE PERFORMED:  Colonoscopy with snare polypectomy x 1.  ENDOSCOPIST:  Anselmo Rod, M.D.  INSTRUMENT USED:  Olympus video colonoscope.  INDICATIONS:  A personal history of polyps in a 77 year old African-American male. Rule out recurrent polyps.  PREPROCEDURE PREPARATION:  Informed consent was procured from the patient. The patient was fasted for 8 hours prior to the procedure and prepped with a bottle of magnesium citrate and a gallon of NuLytely the night prior to the procedure.  PREPROCEDURE PHYSICAL:  Patient has stable vital signs.  NECK: Supple.  CHEST:  Clear to auscultation. S1, S2 regular.  ABDOMEN:  Soft with normal abdominal bowel sounds.  DESCRIPTION OF PROCEDURE:  The patient was placed in the left lateral decubitus position and sedated with 50 mg of Demerol and 5 mg of Versed intravenously.  Once the patient was adequately sedated and maintained on low-flow oxygen and continuous cardiac monitoring, the Olympus video colonoscope was advanced from the rectum to the cecum without difficulty. There was evidence of pan diverticular disease with prominent changes both in the left and right colon.  There was a small sessile polyp removed by snare polypectomy from the distal right colon.  No other abnormalities were noted. The patient tolerated the procedure well without complication.  IMPRESSION: 1. Pan diverticulosis. 2. Small sessile polyp snared from distal right colon. 3. No large masses or polyps seen.  RECOMMENDATIONS: 1. The patient has been advised to increase the fluid and fiber in his diet. 2. Repeat colorectal cancer screening has been recommended in the next 3-5    years depending on the pathology  results. 3. Outpatient follow-up was advised on a p.r.n. basis. DD:  07/15/00 TD:  07/15/00 Job: [id number]

## 2010-10-24 NOTE — Discharge Summary (Signed)
Jose Kane, Jose Kane              ACCOUNT NO.:  [id number]   MEDICAL RECORD NO.:  [id number]          PATIENT TYPE:  INP   LOCATION:  2631                         FACILITY:  [facility]   PHYSICIAN:  Burnard Bunting, M.D.    DATE OF BIRTH:  August 29, 1923   DATE OF ADMISSION:  12/20/2007  DATE OF DISCHARGE:  01/02/2008                               DISCHARGE SUMMARY   ADMISSION DIAGNOSES:  1. End-stage osteoarthritis of the left hip.  2. Diabetes mellitus.  3. Hypertension.  4. Coronary artery disease.  5. Dyslipidemia.  6. Chronic renal insufficiency.  7. History of gout.   DISCHARGE DIAGNOSES:  1. End-stage osteoarthritis of the left hip.  2. Diabetes mellitus.  3. Hypertension.  4. Coronary artery disease.  5. Dyslipidemia.  6. Chronic renal insufficiency.  7. History of gout.  8. Acute on chronic renal insufficiency.  9. Adynamic ileus.  10.Acute respiratory failure with aspiration pneumonia.  11.Hypokalemia and hypophosphatemia, resolved.  12.Acute mental status changes secondary to narcotics, resolved at      discharge.  13.Exacerbation of gout to the right knee treated with intra-articular      steroid injection.  14.Bilateral renal cyst found on ultrasound to require outpatient      evaluation after discharge.  15.Acute blood loss anemia requiring blood transfusion.  16.Leukocytosis secondary to steroid.  17.Mild transaminitis monitored and stable at discharge.  18.Malnutrition treated with TNA during treatment for ileus.   PROCEDURE:  On December 20, 2007, the patient underwent left total hip  arthroplasty performed by Dr. August Saucer, assisted by Dr. Tresa Res under  general anesthesia.   CONSULTATIONS:  1. Felipa Evener, MD.  2. Marcellus Scott, MD.  3. Dr. Lindie Spruce  4. Maree Krabbe, MD.   BRIEF HISTORY:  The patient is an 77 year old black male with a history  of left hip osteoarthritis refractory to nonoperative management.  As it  was causing him significant loss in  independence with activities of  daily living, it was felt he would benefit from a total hip  arthroplasty.  He was admitted for the procedure as stated above.   BRIEF HOSPITAL COURSE:  The patient was placed on the step-down unit  postoperatively for monitoring.  The hospitalist consult was obtained to  assist with his medical care, including his diabetes mellitus.  Initially, the patient was alert and oriented and was stable  postoperatively.  He was started on Coumadin for DVT prophylaxis.  Adjustments in dosages were made according to daily protimes by the  pharmacist.  The patient was started on the usual physical therapy  program for total hip replacement.  He was taught total hip precautions.  He was allowed 50% partial weightbearing to the left lower extremity.  On his first postoperative day, the patient was able to ambulate as much  as 40 feet.  The patient was noted to have elevated creatinine.  He was  noted to have acute on chronic kidney disease felt to be secondary to  renal hypoperfusion and dehydration as well as acute blood loss anemia.  BUN ranged during the hospital stay from 11  on admission to 61 at its  highest value, back down to 15 after treatment.  Creatinine ranging from  1.42 on admission to 3.95 at its highest value and then down to 1.01 at  discharge.  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  Renal consult done by Dr. Arlean Hopping was  performed on December 24, 2007, and he assisted with the patient's renal  evaluation throughout the remainder of the hospital stay.  On December 22, 2007, the patient was found by the nursing staff vomiting brown liquid  and making gurgling sounds in his throat.  He was noted to have a  distended and firm abdomen with no bowel sounds and diminished lung  sounds.  A rapid response team was called.  The patient was seen  by  General Surgery as well as the [facility] hospitalist.  He was treated  for aspiration pneumonia as well as adynamic ileus.  For his adynamic  ileus, he was started on TNA and given an NG tube.  He continued with  the NG tube throughout much of the hospital stay until he was able to be  taking it as a regular diet.  He eventually had large bowel movement on  December 30, 2007.  He was gradually placed back on a low-carbohydrate diet,  gradually ramping up his intake.  He required continued IV fluids  throughout the hospital stay until he was able to resume his diet.  Pharmacy assisted with dosing his TNA.  The patient received a PICC line  through, which he received IV Zosyn for his aspiration pneumonia.  He  remained on IV Zosyn throughout the hospital stay.  Eventually, his  chest x-ray showed clearing of the pneumonia and the patient did not  require antibiotics at discharge.  The patient developed acute mental  status changes through the hospital stay, which were felt to be related  to his medications including Ativan and strong narcotic analgesics.  Once these were discontinued, the patient's mental status returned to  baseline.  The patient had acute blood loss anemia requiring transfusion  of 3 units of packed red blood cells; admission hemoglobin 16.8 dropping  to the lowest value of 8.0 and then responding to transfusions with last  value during the hospital stay being 9.6.  The patient developed  symptoms of gouty arthritis to the right knee with large joint effusion.  His colchicine was restarted.  He did receive an intra-articular steroid  injection to the right knee, which helped with his symptoms.  The  steroid injection also causing mild leukocytosis, which was monitored  and stable at discharge.  Prior to discharge, his effusion was decreased  significantly and he had less pain in the right knee.  As expected, the  patient's progress with physical therapy was initially  quite slow with  his ongoing medical issues.  Eventually, as he was able to clear the  medical issues.  He was ambulatory being 50% partial weightbearing on  the left lower extremity, utilizing a rolling walker.  He was able to  ambulate as much as 180 feet and was felt to be safe from the standpoint  of total hip replacement precautions by the physical therapist.  Eventually, he was able to be discharged to his home, where arrangements  were made for continued physical therapy through [facility].  Other issues during the hospital stay  include suboptimal  control of his hypertension.  Medication doses were adjusted.  The  patient also had suboptimal treatment of his diabetes with poor control  during his postoperative complications.  Medications were adjusted as  well as his Lantus insulin being increased.  The patient had hypokalemia  and hypophosphatemia, which were both treated with supplementation  during the hospital stay and corrected.  Towards the end of the hospital  stay, the general surgery team was able to sign off, as his ileus had  resolved and the patient was tolerating a regular diet.  He was also  having normal bowel movements.  The renal team also was able to sign  off, as he was felt to be at baseline with his chronic renal  insufficiency.  On January 02, 2008, the patient was discharged to his  home.   PERTINENT LABORATORY VALUES:  CBC on admission 6.8.  Elevation to 23 on  December 31, 2007.  At discharge, WBC 18.2.  Once again this was felt to be  related to the effect of intra-articular steroid injection to the right  knee for his gout.  At discharge, hemoglobin and hematocrit stable at  9.6 and 28.7 with platelets 403.  Coagulation studies on admission were  within normal limits.  At discharge, INR 3.1, PT 34.0.  Admission labs  were within normal limits from the standpoint of routine chemistry.  BUN  and creatinine ranging as stated above.  Potassium at  lowest level at  3.2, however, corrected.  The patient's albumin low at the lowest value  of 1.9, however at discharge 2.2.  Total protein initially at 5.4 and an  lowest value 4.6.  At discharge, total protein 5.1.  Cardiac markers  showed elevation in CK, trending from 1482 down to 339 with CK-MB 5.6,  trending down to 5.5.  Troponin levels within normal limits.  TSH within  normal limits.  Prealbumin 7.7.  Urinalysis on December 16, 2007, was  negative for urinary tract infection.  Urine culture on December 16, 2007,  showed 1000 colonies of insignificant growth.  EKG on December 16, 2007,  sinus rhythm with first-degree AV block, premature atrial complexes,  left axis deviation.  On December 22, 2007, EKG sinus tachycardia, left  anterior fascicular block confirmed by Dr. Mayford Knife.  Multiple abdominal  films and chest x-rays obtained during the hospital stay for monitoring  of his aspiration pneumonia and adynamic ileus.  Renal and urinary tract  ultrasound showing bilateral renal cyst.  Recommended follow-up dynamic  MRI after renal insufficiency resolves with outpatient evaluation.  Kidneys, mildly echogenic.  Liver and spleen with difficult  visualization secondary to overlying bowel gas pattern.   PLAN:  The patient was discharged to his home.  He was felt to be stable  from his medical as well as orthopedic issues.  Arrangements made for  home health physical therapy, occupational therapy, and Coumadin  management.  He will continue to be partial weightbearing on the  operative extremity, utilizing a walker or crutches for ambulation.  He  will return to see Dr. August Saucer in approximately 1 week and advised to call  for this appointment.  As for his hip wound, he was advised in no  submersion of the hip.  However, he was allowed to shower as his wound  was healing well.  The patient will continue to follow total hip  replacement precautions.  The patient will continue on Coumadin with  Gentiva to  provide monitoring for dosing  purposes.   MEDICATIONS AT DISCHARGE:  The patient will resume home medications as  taken prior to admission and was given medication reconciliation with  these instructions.  He will increase his colchicine to 0.6 mg 2 times a  day until his gout has improved.  He is placed on metoprolol 25 mg p.o.  b.i.d., Percocet 325 mg 1 every 3-4 hours as needed for pain, and  Coumadin 5 mg daily with his blood draw to be done on January 04, 2008.   FOLLOWUP:  The patient will follow up with his primary care physician  within the month.  If he has questions or concerns prior to his return  office visit, he was advised to call Dr. Diamantina Providence office.  All  instructions given to the patient and his family.  Understanding of  instructions were verbalized.  All questions encouraged and answered.      Wende Neighbors, P.A.      Burnard Bunting, M.D.  Electronically Signed    SMV/MEDQ  D:  02/16/2008  T:  02/16/2008  Job:  [id number]

## 2010-10-24 NOTE — Discharge Summary (Signed)
[place]. Jose Kane  Patient:    Jose Kane, Jose Kane Visit Number: [id number] MRN: [id number]          Service Type: SUR Location: 3300 [id number] [id number] Attending Physician:  Cameron Proud Dictated by:   Tollie Pizza Collins, P.A.-C. Admit Date:  07/13/2001 Discharge Date: 07/17/2001   CC:         Velna Hatchet, M.D.   Discharge Summary  PRIMARY ADMITTING DIAGNOSIS:  Left lung mass.  ADDITIONAL DIAGNOSES: 1. Fibrous tumor of left pleura. 2. Type 2 diabetes mellitus. 3. Hypertension. 4. Hyperlipidemia. 5. History of gout. 6. History of ______ pneumonia in the past.  PROCEDURES PERFORMED: 1. Left VATS. 2. Left minithoracotomy, with resection of left lingular lung mass.  HISTORY:  The patient is a 77 year old black male who was found on a chest x-ray prior to cataract surgery last year to have left lung mass.  He had been completely asymptomatic, having no cough, shortness of breath, hemoptysis, weight loss or fevers.  A follow-up CT scan was performed, which confirmed a 1.8 x 1.1 cm left pleural base lung mass.  It was recommended that he follow up in six months for repeat chest x-ray; at that time it was reported that his mass had increased in size slightly.  He was referred to Dr. Edwyna Shell for further evaluation; it was felt that he should proceed with left VATS for removal of the lesion.  HOSPITAL COURSE:  The patient was admitted on July 13, 2001 and underwent left VATS, resection of the left pleural base mass.  He tolerated the procedure well and was transferred to the Floor in stable condition.  Postoperatively he has done well.  Both his chest tubes have been removed at present, and his follow-up chest x-rays have been within normal limits.  He is to wean off supplemental oxygen and is maintaining O2 saturations of around 90%.  He has been afebrile and all vital signs have been stable.  His incisions are healing well.  He is ambulating in the  halls without difficulty. He is tolerating a regular diet and is having normal bowel and bladder function.  His ______ air remained stable.  He will be ready for discharge home on July 17, 2001.  FINAL PATHOLOGY:  Revealed a fibrous tumor at the pleura.  DISCHARGE MEDICATIONS: 1. Lipitor 10 mg q.d. 2. Accupril 20 mg q.d. 3. Glucotrol 10 mg q.d. 4. Aspirin 81 mg q.d. 5. Colchicine p.r.n. 6. Norvasc 10 mg q.d. 7. Tylox 1-2 q.4h. p.r.n. pain.  DISCHARGE INSTRUCTIONS:  He is to refrain from driving, heavy lifting or strenuous activity.  He should continue daily walking and incentive spirometry exercises.  He is asked to continue the same preoperative diet.  He should shower daily and clean his incisions with soap and water.  DISCHARGE FOLLOW-UP:  He will see Dr. Edwyna Shell in the office in one week.  [facility] office will call and schedule this appointment.  He is asked to have a chest x-ray at the [facility] one hour prior to his appointment with Dr. Edwyna Shell. Dictated by:   Tollie Pizza Collins, P.A.-C. Attending Physician:  Cameron Proud DD:  07/16/01 TD:  07/18/01 Job: [id number]

## 2010-10-24 NOTE — Op Note (Signed)
[place]. [facility]  Patient:    Jose Kane, Jose Kane Visit Number: [id number] MRN: [id number]          Service Type: SUR Location: 3300 3304 [id number] Attending Physician:  Cameron Proud Dictated by:   D. Karle Plumber, M.D. Admit Date:  07/13/2001   CC:         Velna Hatchet, M.D.   Operative Report  PREOPERATIVE DIAGNOSIS:  Left pleural-based mass.  POSTOPERATIVE DIAGNOSIS:  Fibrous tumor of left lingula.  OPERATION PERFORMED:  Left video-assisted thoracic surgery, resection of fibrous tumor.  SURGEON:  D. Karle Plumber, M.D.  FIRST ASSISTANT:  Mr. Lissa Merlin, P.A.  ANESTHESIA:  General anesthesia.  DESCRIPTION OF PROCEDURE:  After general anesthesia, patient was prepped and draped in the usual sterile manner and was turned to the left lateral thoracotomy position.  Dual-lumen tube was inserted.  After being prepped and draped in the usual sterile manner, two trocar sites were placed in the anterior and posterior axillary lines at the seventh intercostal space.  Two trocars were inserted and using a 30 degree scope, the lesion could be seen coming off the lingula; it was a ______ -cm lesion.  A 3-cm incision was made over the sixth intercostal space.  Dissection was carried down for direct visualization.  The lesion was grasped with a lung clamp and then resected with several applications of the SCB-45 Ethicon stapler.  There was no leak from the staple line but this was reinforced with FocalSeal, using first the primer and then the sealant and then using the ultraviolet wand for ______. Biopsies of the 11-L and a 9-L node were done and then electrocautery was used to stop all bleeding.  Two chest tubes were brought in through the trocar sites and tied in place with 0 silk.  The small incisions were closed with 1 pericostals, #1 Vicryl in the muscle layer, 2-0 Vicryl in the subcutaneous tissue and skin clips.  Patient returned to the  recovery room in stable condition. Dictated by:   D. Karle Plumber, M.D. Attending Physician:  Cameron Proud DD:  07/13/01 TD:  07/13/01 Job: [id number]

## 2010-10-24 NOTE — Op Note (Signed)
[place]  Patient:    Jose Kane, Jose Kane                       MRN: [id number] Proc. Date: 10/13/00 Attending:  Blake Divine., M.D.                           Operative Report  PREOPERATIVE DIAGNOSES: 1. Visually significant cataract interfering with the patients daily    activities. 2. Insulin-dependent diabetes.  POSTOPERATIVE DIAGNOSES: 1. Visually significant cataract interfering with the patients daily    activities. 2. Insulin-dependent diabetes.  PROCEDURE:  Phacoemulsification with intraocular lens implant, left eye.  ANESTHESIA:  2% Xylocaine in a 50-50 mixture with 0.75% Marcaine.  DESCRIPTION OF PROCEDURE:  The patient was taken to the operating room, where the aforementioned local anesthetic agent was used to give a peribulbar block to the eye.  Following this and ocular massage, the patients face was prepped and draped in the usual sterile fashion, and then the operating microscope was positioned temporally with the wire lid speculum inserted.  A Weck-Cel sponge was used to fixate the eye, and the 2.5 mm keratome was used to make a stepwise incision into the anterior chamber.  Prior to this, a stab incision was made at the 6 oclock position, and Provisc was injected in the eye.  A bent needle was used to form a continuous-tear curvilinear capsulorhexis, removing portions of the anterior capsule from the eye.  BSS was then used to hydrodissect and hydrodelineate the nucleus.  Following this, the phacoemulsification unit was then used to sculpt a trough and then as rotation took place, it was noted that the nucleus did not stay in place on rotation; therefore, a bowl was sculpted with a very white lens opacity noted in the inferior position.  Following this after sculpting had taken place with the I/A unit in the pulse mode, the nucleus was brought anteriorly and a portion of the nucleus was removed, but the rest of the nucleus  would not easily follow.  Therefore, additional viscoelastic was injected under the nucleus to elevate the nucleus into the iris plane.  The rest of the phacoemulsification then took place in the iris plane, removing the nucleus and the cortical shell.  Following this, the I/A was used, but there was very little remaining cortical material at this time.  The posterior capsule remained intact with the anterior capsular rim also intact.  The phaco time was five at 7%. Following this, the incision was extended to 3.5 mm, and the AcrySof UV posterior chamber intraocular lens 25 diopter, model #MA60AC.250, was then inspected, taken from the package.  The original lens was placed on the operative site and while under the microscope, the lens slid out of the surgical field and was not recovered.  Therefore, a second intraocular lens implant had to be taken from the package.  This lens was then folded with viscoelastic filling the bag.  The lens was rotated and passed into the anterior chamber with the leading haptic going into the capsular bag.  The lens was then turned anterior and released in the posterior chamber. Following this, an angled Kelman was used to grasp the trailing haptic, and the haptic was curled and placed under the iris.  Following this, the forceps were removed from the eye, and the haptic popped back out of position.  The haptic was again grasped, rotated, and positioned in  the eye, and a Lester forceps was used to center the lens.  It was noted at this point that the pupil was then irregular.  Miochol was injected in the eye, and the pupil became pink near the wound edge.  The wound was checked, and it was noticed that there was one strand of vitreous.  This was cut with the scissors, releasing it, and then the pupil came down in a round fashion.  There being no vitreous at the wound, one 10-0 nylon suture was placed to secure the wound. BSS was injected in the eye.  There  being no leakage present, the procedure was then ended.  Topical Tobradex ointment was applied to the eye.  A patch and Fox shield were placed, and then the patient returned to the recovery area in stable condition. DD:  10/13/00 TD:  10/14/00 Job: [id number]

## 2010-10-24 NOTE — H&P (Signed)
[place]. [facility]  Patient:    Jose Kane, Jose Kane Visit Number: [id number] MRN: [id number]          Service Type: Attending:  D. Karle Plumber, M.D. Dictated by:   Tollie Pizza Collins, P.A.-C. Adm. Date:  07/13/01   CC:         Velna Hatchet, M.D.                         History and Physical  CHIEF COMPLAINT:  Left lung mass.  HISTORY OF PRESENT ILLNESS:  The patient is a 77 year old black male who was referred by Dr. Velna Hatchet for an evaluation of a left lung mass.  This was found on a routine chest x-ray in May 2002, which was performed prior to his cataract surgery.  The patient denies any symptoms of cough, except a cough related to his chronic allergic rhinitis.  He denies any sputum production, hemoptysis, fever, chills, weight loss, shortness of breath, orthopnea, or dyspnea on exertion.  A follow-up CT scan confirmed a 1.8 cm x 1.1 cm left pleural-based lung mass.  This was followed up in six months and a repeat chest x-ray reportedly showed a slight increase in size of the mass.  He was referred to Dr. Algis Downs. Karle Plumber for further evaluation, and it is felt that at this time he should proceed with a left video-assisted thoracoscopic surgery and the removal of the lesion.  PAST MEDICAL HISTORY 1. Type2 diabetes mellitus. 2. Hypertension. 3. Hyperlipidemia. 4. Gout. 5. History of sitacosis pneumonia in 1987.  PAST SURGICAL HISTORY:  Left cataract removal with lens implant in May 2002.  CURRENT MEDICATIONS 1. Lipitor 10 mg q.d. 2. Glucotrol 10 mg q.d. 3. Accupril 20 mg q.d. 4. Norvasc 10 mg q.d. 5. Enteric-coated aspirin 325 mg two times a week. 6. Colchicine p.r.n. 7. Tylenol allergy and sinus p.r.n.  ALLERGIES:  No known drug allergies.  FAMILY HISTORY:  His mother died at age 70 of cancer.  His father died with a CVA.  He has one sister who died at age 45 of cancer, and two brothers who died at age 67 and 13 respectively, both  with myocardial infarctions.  SOCIAL HISTORY:  He is married with three children.  He is currently retired. He has a history of tobacco use, of around one pack of cigarettes daily for about 40 years or more.  He quit in 1987.  He denies any alcohol use.  REVIEW OF SYSTEMS:  He denies any history of weight loss, fever, chills, nausea, vomiting, diarrhea, constipation, chronic abdominal pain or reflux symptoms, chest pain, shortness of breath, cough, hemoptysis, dysuria, lower extremity edema, claudication, nonhealing ulcers, depression or anxiety, visual changes, headaches, TIA symptoms, or seizures.  PHYSICAL EXAMINATION  VITAL SIGNS:  Blood pressure 180/92, pulse 92 and regular, respirations 16 and unlabored.  GENERAL:  This is a well-developed, well-nourished white male, who looks younger than his stated age.  He is in no acute distress.  HEENT:  Normocephalic, atraumatic.  Pupils equal, round, reactive to light and accommodation.  Extraocular movements intact.  He is status post a left cataract removal with a lens implant.  NECK:  Supple without lymphadenopathy, thyromegaly, or carotid bruits.  LUNGS:  Clear to auscultation.  HEART:  A regular rate and rhythm without murmurs, rubs, or gallops.  ABDOMEN:  Soft, nontender, nondistended, with active bowel sounds in all quadrants.  EXTREMITIES:  No clubbing, cyanosis, or edema.  The lower extremities are warm and pulses are 2+ and symmetrical in the femoral, dorsalis pedis, and posterior tibial pulses.  NEUROLOGIC:  Cranial nerves II-XII are grossly intact.  Gait is within normal limits.  He has symmetrical and good muscular strength and sensation.  GENITOURINARY:  Examination deferred at this time.  RECTAL:  Examination deferred at this time.  ASSESSMENT/PLAN:  This is a 77 year old black male with a left pleural-based lung mass, who presents at this time for a left video-assisted thoracoscopic surgery and probable left  chest wall resection.  This will be performed by Dr. Norton Blizzard on July 13, 2001. Dictated by:   Tollie Pizza Collins, P.A.-C. Attending:  D. Karle Plumber, M.D. DD:  07/11/01 TD:  07/11/01 Job: [id number]

## 2010-10-24 NOTE — H&P (Signed)
[place]. [facility]  Patient:    Jose Kane, DURFLINGER Visit Number: [id number] MRN: [id number]          Service Type: SUR Location: [id number] Attending Physician:  Cameron Proud Dictated by:   Jennet Maduro Earl Gala, R.N., A.N.P. Admit Date:  07/13/2001 Discharge Date: 07/17/2001   CC:         Windle Guard, M.D.  Quinn Plowman, P.A., [facility]   History and Physical  DATE OF BIRTH:  July 31, 1941  CHIEF COMPLAINT:  Shortness of breath.  HISTORY OF PRESENT ILLNESS:  Mr. Jose Kane is a 77 year old married black male, who presents to our office for evaluation following adenosine Cardiolite study.  He had been referred here for evaluation due to cardiac enlargement noted on chest x-ray, as well as shortness of breath and a left bundle branch block.  His adenosine Cardiolite demonstrated a fixed septal perfusion defect involving the anterior and inferior septum with partial redistribution, left ventricular enlargement, with reduced wall motion in the anterior septal and inferior wall distribution.  In light of these findings, it is felt that he needs to be referred on for further evaluation to include cardiac catheterization.  From a cardiac standpoint, he notes that he has had progressive shortness of breath, basically over the past three months, as well as an increase in abdominal girth and increased orthopnea.  Approximately three months ago, while sawing wood in the rain, he had the onset of neck discomfort and was diagnosed with a herniated disk.  This was treated conservatively and he had just returned back to work.  During that timeframe, he had been placed on some type of oral diabetic agent, which was felt to be the triggering event for his shortness of breath.  With cessation of this medicine, however, he had no real improvement.  He has had no real chest discomfort or tightness.  He was treated with a short course of diuretics with  improvement.  PAST MEDICAL HISTORY: 1. Significant for diabetes x4-5 years. 2. History of cervical spine surgery in 1994. 3. History of lithotripsy in 2001.  ALLERGIES:  None.  CURRENT MEDICINES: 1. Lipitor 10 mg a day. 2. Metformin twice a day. 3. Aspirin daily.  FAMILY HISTORY:  Both of his parents are deceased.  Father died at age 52 of diabetes.  Mother died at the age of 44 due to heart disease, hypertension, and diabetes.  SOCIAL HISTORY:  He is married.  He has one son and one daughter.  He has had no tobacco products and no alcohol products.  He is employed at [employer].  REVIEW OF SYSTEMS:  Basically as noted above and is otherwise unremarkable. He denies any problems with light-headedness or dizziness.  He has had no faints, no strokes.  Appetite remains good.  He does engage in excessive amount of sodium intake.  He has had no reflux, no abdominal pain, constipation, diarrhea.  He has noted some recent peripheral edema, especially an increase in abdominal girth.  PHYSICAL EXAMINATION:  GENERAL:  On physical exam he is a very pleasant black male in no acute distress.  VITAL SIGNS:  Blood pressure is 90/70 sitting, 100/70 standing, heart rate is 104 and regular, respirations 18.  He is afebrile.  SKIN:  Warm and dry.  Color is unremarkable.  NECK:  Supple.  He does have increased neck veins.  LUNGS:  Basically clear to auscultation.  CARDIAC:  Shows an S3 gallop.  ABDOMEN:  Obese, yet  soft.  Positive bowel sounds and without masses.  EXTREMITIES:  Show no significant edema.  Distal pulses are somewhat diminished.  LABORATORY DATA:  Pending.  IMPRESSION: 1. Abnormal adenosine Cardiolite, suggesting fixed septal profusion defect    involving the anterior and inferior septum with partial redistribution,    left ventricular enlargement, as well as reduced wall motion with ejection    fraction estimated to be 22%. 2. Recent shortness of  breath. 3. Diabetes.  PLAN:  Will proceed on with elective coronary angiography.  The procedure has been discussed in full detail and he is willing to proceed. Dictated by:   Jennet Maduro Earl Gala, R.N., A.N.P. Attending Physician:  Cameron Proud DD:  07/11/01 TD:  07/11/01 Job: [id number]

## 2010-12-11 ENCOUNTER — Ambulatory Visit
Admission: RE | Admit: 2010-12-11 | Discharge: 2010-12-11 | Disposition: A | Discharge status: Home / Self Care | Payer: Medicare Other | Source: Ambulatory Visit | Attending: Internal Medicine | Admitting: Internal Medicine

## 2010-12-11 ENCOUNTER — Other Ambulatory Visit: Payer: Self-pay | Admitting: Internal Medicine

## 2010-12-11 DIAGNOSIS — R05 Cough: Secondary | ICD-10-CM

## 2011-03-05 LAB — CBC
MCV: 81.8
Platelets: 214
RBC: 5.96 — ABNORMAL HIGH
WBC: 6.8

## 2011-03-05 LAB — BASIC METABOLIC PANEL
BUN: 11
Calcium: 9.7
Chloride: 104
Creatinine, Ser: 1.42
GFR calc Af Amer: 58 — ABNORMAL LOW

## 2011-03-05 LAB — POCT I-STAT EG7
HCT: 39
Hemoglobin: 13.3
Operator id: 144681
pCO2, Ven: 47.1
pO2, Ven: 101 — ABNORMAL HIGH

## 2011-03-05 LAB — URINE MICROSCOPIC-ADD ON

## 2011-03-05 LAB — URINE CULTURE: Colony Count: 1000

## 2011-03-05 LAB — URINALYSIS, ROUTINE W REFLEX MICROSCOPIC
Glucose, UA: NEGATIVE
Hgb urine dipstick: NEGATIVE
Ketones, ur: NEGATIVE
Leukocytes, UA: NEGATIVE
Protein, ur: 30 — AB
Urobilinogen, UA: 0.2

## 2011-03-05 LAB — PROTIME-INR
INR: 1
Prothrombin Time: 13.4

## 2011-03-05 LAB — TYPE AND SCREEN
ABO/RH(D): O POS
Antibody Screen: NEGATIVE

## 2011-03-05 LAB — HEMOGLOBIN: Hemoglobin: 10.4 — ABNORMAL LOW

## 2011-03-05 LAB — ABO/RH: ABO/RH(D): O POS

## 2011-03-06 LAB — BASIC METABOLIC PANEL
BUN: 16
BUN: 27 — ABNORMAL HIGH
BUN: 38 — ABNORMAL HIGH
BUN: 45 — ABNORMAL HIGH
BUN: 56 — ABNORMAL HIGH
CO2: 26
CO2: 27
CO2: 28
Calcium: 7.4 — ABNORMAL LOW
Calcium: 7.7 — ABNORMAL LOW
Calcium: 7.9 — ABNORMAL LOW
Calcium: 7.9 — ABNORMAL LOW
Calcium: 8 — ABNORMAL LOW
Calcium: 8.3 — ABNORMAL LOW
Calcium: 8.3 — ABNORMAL LOW
Calcium: 8.7
Chloride: 107
Chloride: 99
Creatinine, Ser: 1.19
Creatinine, Ser: 1.66 — ABNORMAL HIGH
Creatinine, Ser: 2.79 — ABNORMAL HIGH
Creatinine, Ser: 2.81 — ABNORMAL HIGH
GFR calc Af Amer: 26 — ABNORMAL LOW
GFR calc Af Amer: 48 — ABNORMAL LOW
GFR calc Af Amer: >60
GFR calc non Af Amer: 22 — ABNORMAL LOW
GFR calc non Af Amer: 27 — ABNORMAL LOW
GFR calc non Af Amer: 29 — ABNORMAL LOW
GFR calc non Af Amer: 56 — ABNORMAL LOW
GFR calc non Af Amer: >60
Glucose, Bld: 100 — ABNORMAL HIGH
Glucose, Bld: 138 — ABNORMAL HIGH
Glucose, Bld: 154 — ABNORMAL HIGH
Glucose, Bld: 175 — ABNORMAL HIGH
Potassium: 3.5
Potassium: 4.7
Sodium: 134 — ABNORMAL LOW
Sodium: 139

## 2011-03-06 LAB — CBC
HCT: 25 — ABNORMAL LOW
HCT: 27.7 — ABNORMAL LOW
HCT: 28.3 — ABNORMAL LOW
HCT: 29.2 — ABNORMAL LOW
Hemoglobin: 8 — ABNORMAL LOW
Hemoglobin: 8.5 — ABNORMAL LOW
Hemoglobin: 8.8 — ABNORMAL LOW
Hemoglobin: 9.6 — ABNORMAL LOW
MCHC: 33.2
MCHC: 33.9
MCHC: 34
MCHC: 34.2
MCHC: 34.5
MCHC: 34.7
MCHC: 35.1
MCHC: 35.2
MCV: 82.6
MCV: 82.7
MCV: 83.4
MCV: 83.5
MCV: 83.6
MCV: 83.7
MCV: 85.5
MCV: 85.5
Platelets: 143 — ABNORMAL LOW
Platelets: 168
Platelets: 213
Platelets: 226
Platelets: 278
Platelets: 313
Platelets: 403 — ABNORMAL HIGH
RBC: 2.81 — ABNORMAL LOW
RBC: 3.03 — ABNORMAL LOW
RBC: 3.08 — ABNORMAL LOW
RBC: 3.36 — ABNORMAL LOW
RBC: 3.41 — ABNORMAL LOW
RDW: 16.4 — ABNORMAL HIGH
RDW: 16.5 — ABNORMAL HIGH
RDW: 16.5 — ABNORMAL HIGH
RDW: 16.6 — ABNORMAL HIGH
WBC: 10.2
WBC: 11 — ABNORMAL HIGH
WBC: 11.4 — ABNORMAL HIGH
WBC: 14.4 — ABNORMAL HIGH
WBC: 18.2 — ABNORMAL HIGH
WBC: 21.7 — ABNORMAL HIGH
WBC: 22.2 — ABNORMAL HIGH
WBC: 6.1
WBC: 9.9

## 2011-03-06 LAB — COMPREHENSIVE METABOLIC PANEL
ALT: 18
ALT: 29
ALT: 83 — ABNORMAL HIGH
AST: 27
AST: 32
AST: 53 — ABNORMAL HIGH
AST: 54 — ABNORMAL HIGH
AST: 55 — ABNORMAL HIGH
Albumin: 1.9 — ABNORMAL LOW
Albumin: 2 — ABNORMAL LOW
Alkaline Phosphatase: 50
Alkaline Phosphatase: 79
BUN: 15
BUN: 61 — ABNORMAL HIGH
CO2: 20
CO2: 23
CO2: 25
CO2: 26
CO2: 28
Calcium: 7.4 — ABNORMAL LOW
Calcium: 7.9 — ABNORMAL LOW
Calcium: 8 — ABNORMAL LOW
Calcium: 8.3 — ABNORMAL LOW
Chloride: 107
Creatinine, Ser: 1.01
Creatinine, Ser: 1.17
Creatinine, Ser: 1.24
Creatinine, Ser: 3.95 — ABNORMAL HIGH
GFR calc Af Amer: 18 — ABNORMAL LOW
GFR calc Af Amer: 34 — ABNORMAL LOW
GFR calc Af Amer: >60
GFR calc Af Amer: >60
GFR calc non Af Amer: 15 — ABNORMAL LOW
GFR calc non Af Amer: 28 — ABNORMAL LOW
GFR calc non Af Amer: >60
GFR calc non Af Amer: >60
Glucose, Bld: 195 — ABNORMAL HIGH
Glucose, Bld: 216 — ABNORMAL HIGH
Glucose, Bld: 82
Potassium: 4.1
Potassium: 4.1
Sodium: 132 — ABNORMAL LOW
Sodium: 136
Sodium: 139
Total Bilirubin: 1.1
Total Protein: 5.3 — ABNORMAL LOW

## 2011-03-06 LAB — PROTIME-INR
INR: 1.3
INR: 1.4
INR: 1.4
INR: 1.7 — ABNORMAL HIGH
INR: 2.1 — ABNORMAL HIGH
INR: 3.1 — ABNORMAL HIGH
INR: 3.1 — ABNORMAL HIGH
INR: 3.2 — ABNORMAL HIGH
Prothrombin Time: 16.9 — ABNORMAL HIGH
Prothrombin Time: 17.2 — ABNORMAL HIGH
Prothrombin Time: 18.8 — ABNORMAL HIGH
Prothrombin Time: 19.1 — ABNORMAL HIGH
Prothrombin Time: 20.7 — ABNORMAL HIGH
Prothrombin Time: 21.5 — ABNORMAL HIGH
Prothrombin Time: 24.5 — ABNORMAL HIGH
Prothrombin Time: 30.1 — ABNORMAL HIGH
Prothrombin Time: 33.8 — ABNORMAL HIGH
Prothrombin Time: 34 — ABNORMAL HIGH
Prothrombin Time: 34.5 — ABNORMAL HIGH
Prothrombin Time: 35.7 — ABNORMAL HIGH

## 2011-03-06 LAB — DIFFERENTIAL
Eosinophils Absolute: 0.2
Eosinophils Relative: 0
Eosinophils Relative: 1
Lymphocytes Relative: 10 — ABNORMAL LOW
Lymphocytes Relative: 9 — ABNORMAL LOW
Lymphs Abs: 0.7
Lymphs Abs: 1.1
Monocytes Absolute: 1.5 — ABNORMAL HIGH
Monocytes Relative: 14 — ABNORMAL HIGH
Neutro Abs: 5.7

## 2011-03-06 LAB — CROSSMATCH: ABO/RH(D): O POS

## 2011-03-06 LAB — BLOOD GAS, ARTERIAL
Acid-base deficit: 0.2
O2 Content: 4
O2 Saturation: 90.7
Patient temperature: 98.6
pCO2 arterial: 35.7

## 2011-03-06 LAB — HEMOGLOBIN AND HEMATOCRIT, BLOOD
HCT: 27.3 — ABNORMAL LOW
Hemoglobin: 9.1 — ABNORMAL LOW

## 2011-03-06 LAB — NA AND K (SODIUM & POTASSIUM), RAND UR: Sodium, Ur: <10

## 2011-03-06 LAB — CARDIAC PANEL(CRET KIN+CKTOT+MB+TROPI)
CK, MB: 5.5 — ABNORMAL HIGH
Relative Index: 0.7
Total CK: 1010 — ABNORMAL HIGH
Total CK: 801 — ABNORMAL HIGH
Troponin I: 0.02

## 2011-03-06 LAB — MAGNESIUM: Magnesium: 2.6 — ABNORMAL HIGH

## 2011-03-06 LAB — PREPARE RBC (CROSSMATCH)

## 2011-03-06 LAB — OCCULT BLOOD GASTRIC / DUODENUM (SPECIMEN CUP): Occult Blood, Gastric: POSITIVE — AB

## 2011-03-06 LAB — CREATININE, URINE, RANDOM: Creatinine, Urine: 185.8

## 2011-03-06 LAB — PHOSPHORUS
Phosphorus: 1.6 — ABNORMAL LOW
Phosphorus: 2.6
Phosphorus: 4.5

## 2011-03-06 LAB — CK: Total CK: 604 — ABNORMAL HIGH

## 2011-03-06 LAB — PREALBUMIN: Prealbumin: 7.7 — ABNORMAL LOW

## 2011-06-16 DIAGNOSIS — B351 Tinea unguium: Secondary | ICD-10-CM | POA: Diagnosis not present

## 2011-06-16 DIAGNOSIS — M79609 Pain in unspecified limb: Secondary | ICD-10-CM | POA: Diagnosis not present

## 2011-08-19 DIAGNOSIS — H538 Other visual disturbances: Secondary | ICD-10-CM | POA: Diagnosis not present

## 2011-08-19 DIAGNOSIS — H524 Presbyopia: Secondary | ICD-10-CM | POA: Diagnosis not present

## 2011-08-19 DIAGNOSIS — E11311 Type 2 diabetes mellitus with unspecified diabetic retinopathy with macular edema: Secondary | ICD-10-CM | POA: Diagnosis not present

## 2011-08-19 DIAGNOSIS — E119 Type 2 diabetes mellitus without complications: Secondary | ICD-10-CM | POA: Diagnosis not present

## 2011-08-28 DIAGNOSIS — M1610 Unilateral primary osteoarthritis, unspecified hip: Secondary | ICD-10-CM | POA: Diagnosis not present

## 2011-09-01 DIAGNOSIS — M79609 Pain in unspecified limb: Secondary | ICD-10-CM | POA: Diagnosis not present

## 2011-09-01 DIAGNOSIS — B351 Tinea unguium: Secondary | ICD-10-CM | POA: Diagnosis not present

## 2011-09-18 DIAGNOSIS — Z79899 Other long term (current) drug therapy: Secondary | ICD-10-CM | POA: Diagnosis not present

## 2011-09-18 DIAGNOSIS — I10 Essential (primary) hypertension: Secondary | ICD-10-CM | POA: Diagnosis not present

## 2011-09-18 DIAGNOSIS — E1129 Type 2 diabetes mellitus with other diabetic kidney complication: Secondary | ICD-10-CM | POA: Diagnosis not present

## 2011-09-18 DIAGNOSIS — Z23 Encounter for immunization: Secondary | ICD-10-CM | POA: Diagnosis not present

## 2011-09-18 DIAGNOSIS — E8881 Metabolic syndrome: Secondary | ICD-10-CM | POA: Diagnosis not present

## 2011-09-18 DIAGNOSIS — Z0000 Encounter for general adult medical examination without abnormal findings: Secondary | ICD-10-CM | POA: Diagnosis not present

## 2011-09-18 DIAGNOSIS — I129 Hypertensive chronic kidney disease with stage 1 through stage 4 chronic kidney disease, or unspecified chronic kidney disease: Secondary | ICD-10-CM | POA: Diagnosis not present

## 2011-09-18 DIAGNOSIS — R0602 Shortness of breath: Secondary | ICD-10-CM | POA: Diagnosis not present

## 2011-10-07 DIAGNOSIS — H43819 Vitreous degeneration, unspecified eye: Secondary | ICD-10-CM | POA: Diagnosis not present

## 2011-10-07 DIAGNOSIS — H35379 Puckering of macula, unspecified eye: Secondary | ICD-10-CM | POA: Diagnosis not present

## 2011-10-07 DIAGNOSIS — E119 Type 2 diabetes mellitus without complications: Secondary | ICD-10-CM | POA: Diagnosis not present

## 2011-10-07 DIAGNOSIS — H353190 Nonexudative age-related macular degeneration, unspecified eye, stage unspecified: Secondary | ICD-10-CM | POA: Diagnosis not present

## 2011-11-10 DIAGNOSIS — M79609 Pain in unspecified limb: Secondary | ICD-10-CM | POA: Diagnosis not present

## 2011-11-10 DIAGNOSIS — B351 Tinea unguium: Secondary | ICD-10-CM | POA: Diagnosis not present

## 2011-12-31 DIAGNOSIS — M169 Osteoarthritis of hip, unspecified: Secondary | ICD-10-CM | POA: Diagnosis not present

## 2012-01-19 DIAGNOSIS — B351 Tinea unguium: Secondary | ICD-10-CM | POA: Diagnosis not present

## 2012-01-19 DIAGNOSIS — M79609 Pain in unspecified limb: Secondary | ICD-10-CM | POA: Diagnosis not present

## 2012-01-21 DIAGNOSIS — M949 Disorder of cartilage, unspecified: Secondary | ICD-10-CM | POA: Diagnosis not present

## 2012-01-21 DIAGNOSIS — E1129 Type 2 diabetes mellitus with other diabetic kidney complication: Secondary | ICD-10-CM | POA: Diagnosis not present

## 2012-01-21 DIAGNOSIS — E559 Vitamin D deficiency, unspecified: Secondary | ICD-10-CM | POA: Diagnosis not present

## 2012-01-21 DIAGNOSIS — I129 Hypertensive chronic kidney disease with stage 1 through stage 4 chronic kidney disease, or unspecified chronic kidney disease: Secondary | ICD-10-CM | POA: Diagnosis not present

## 2012-01-21 DIAGNOSIS — Z79899 Other long term (current) drug therapy: Secondary | ICD-10-CM | POA: Diagnosis not present

## 2012-01-21 DIAGNOSIS — M899 Disorder of bone, unspecified: Secondary | ICD-10-CM | POA: Diagnosis not present

## 2012-02-03 DIAGNOSIS — H353190 Nonexudative age-related macular degeneration, unspecified eye, stage unspecified: Secondary | ICD-10-CM | POA: Diagnosis not present

## 2012-02-03 DIAGNOSIS — H35379 Puckering of macula, unspecified eye: Secondary | ICD-10-CM | POA: Diagnosis not present

## 2012-02-03 DIAGNOSIS — H43819 Vitreous degeneration, unspecified eye: Secondary | ICD-10-CM | POA: Diagnosis not present

## 2012-03-08 DIAGNOSIS — Z23 Encounter for immunization: Secondary | ICD-10-CM | POA: Diagnosis not present

## 2012-03-22 DIAGNOSIS — B351 Tinea unguium: Secondary | ICD-10-CM | POA: Diagnosis not present

## 2012-03-22 DIAGNOSIS — M79609 Pain in unspecified limb: Secondary | ICD-10-CM | POA: Diagnosis not present

## 2012-05-24 DIAGNOSIS — M79609 Pain in unspecified limb: Secondary | ICD-10-CM | POA: Diagnosis not present

## 2012-05-24 DIAGNOSIS — B351 Tinea unguium: Secondary | ICD-10-CM | POA: Diagnosis not present

## 2012-05-25 DIAGNOSIS — E1129 Type 2 diabetes mellitus with other diabetic kidney complication: Secondary | ICD-10-CM | POA: Diagnosis not present

## 2012-05-25 DIAGNOSIS — Z79899 Other long term (current) drug therapy: Secondary | ICD-10-CM | POA: Diagnosis not present

## 2012-05-25 DIAGNOSIS — J3489 Other specified disorders of nose and nasal sinuses: Secondary | ICD-10-CM | POA: Diagnosis not present

## 2012-05-25 DIAGNOSIS — I129 Hypertensive chronic kidney disease with stage 1 through stage 4 chronic kidney disease, or unspecified chronic kidney disease: Secondary | ICD-10-CM | POA: Diagnosis not present

## 2012-08-03 DIAGNOSIS — H353190 Nonexudative age-related macular degeneration, unspecified eye, stage unspecified: Secondary | ICD-10-CM | POA: Diagnosis not present

## 2012-08-03 DIAGNOSIS — H35379 Puckering of macula, unspecified eye: Secondary | ICD-10-CM | POA: Diagnosis not present

## 2012-08-18 DIAGNOSIS — B351 Tinea unguium: Secondary | ICD-10-CM | POA: Diagnosis not present

## 2012-08-18 DIAGNOSIS — M79609 Pain in unspecified limb: Secondary | ICD-10-CM | POA: Diagnosis not present

## 2012-09-29 DIAGNOSIS — E7800 Pure hypercholesterolemia, unspecified: Secondary | ICD-10-CM | POA: Diagnosis not present

## 2012-09-29 DIAGNOSIS — Z79899 Other long term (current) drug therapy: Secondary | ICD-10-CM | POA: Diagnosis not present

## 2012-09-29 DIAGNOSIS — Z0000 Encounter for general adult medical examination without abnormal findings: Secondary | ICD-10-CM | POA: Diagnosis not present

## 2012-09-29 DIAGNOSIS — I1310 Hypertensive heart and chronic kidney disease without heart failure, with stage 1 through stage 4 chronic kidney disease, or unspecified chronic kidney disease: Secondary | ICD-10-CM | POA: Diagnosis not present

## 2012-09-29 DIAGNOSIS — E669 Obesity, unspecified: Secondary | ICD-10-CM | POA: Diagnosis not present

## 2012-09-29 DIAGNOSIS — E1129 Type 2 diabetes mellitus with other diabetic kidney complication: Secondary | ICD-10-CM | POA: Diagnosis not present

## 2012-09-29 DIAGNOSIS — R351 Nocturia: Secondary | ICD-10-CM | POA: Diagnosis not present

## 2012-09-29 DIAGNOSIS — Z6830 Body mass index (BMI) 30.0-30.9, adult: Secondary | ICD-10-CM | POA: Diagnosis not present

## 2012-09-29 DIAGNOSIS — E559 Vitamin D deficiency, unspecified: Secondary | ICD-10-CM | POA: Diagnosis not present

## 2012-09-29 DIAGNOSIS — I129 Hypertensive chronic kidney disease with stage 1 through stage 4 chronic kidney disease, or unspecified chronic kidney disease: Secondary | ICD-10-CM | POA: Diagnosis not present

## 2012-10-27 DIAGNOSIS — M79609 Pain in unspecified limb: Secondary | ICD-10-CM | POA: Diagnosis not present

## 2012-10-27 DIAGNOSIS — B351 Tinea unguium: Secondary | ICD-10-CM | POA: Diagnosis not present

## 2012-11-04 DIAGNOSIS — E1129 Type 2 diabetes mellitus with other diabetic kidney complication: Secondary | ICD-10-CM | POA: Diagnosis not present

## 2012-11-04 DIAGNOSIS — Z79899 Other long term (current) drug therapy: Secondary | ICD-10-CM | POA: Diagnosis not present

## 2012-11-04 DIAGNOSIS — E7800 Pure hypercholesterolemia, unspecified: Secondary | ICD-10-CM | POA: Diagnosis not present

## 2012-11-04 DIAGNOSIS — I1310 Hypertensive heart and chronic kidney disease without heart failure, with stage 1 through stage 4 chronic kidney disease, or unspecified chronic kidney disease: Secondary | ICD-10-CM | POA: Diagnosis not present

## 2012-11-30 DIAGNOSIS — M25569 Pain in unspecified knee: Secondary | ICD-10-CM | POA: Diagnosis not present

## 2012-12-05 DIAGNOSIS — E7800 Pure hypercholesterolemia, unspecified: Secondary | ICD-10-CM | POA: Diagnosis not present

## 2012-12-05 DIAGNOSIS — M25569 Pain in unspecified knee: Secondary | ICD-10-CM | POA: Diagnosis not present

## 2012-12-05 DIAGNOSIS — M109 Gout, unspecified: Secondary | ICD-10-CM | POA: Diagnosis not present

## 2012-12-05 DIAGNOSIS — M009 Pyogenic arthritis, unspecified: Secondary | ICD-10-CM | POA: Diagnosis not present

## 2012-12-05 DIAGNOSIS — Z79899 Other long term (current) drug therapy: Secondary | ICD-10-CM | POA: Diagnosis not present

## 2012-12-15 DIAGNOSIS — M169 Osteoarthritis of hip, unspecified: Secondary | ICD-10-CM | POA: Diagnosis not present

## 2012-12-15 DIAGNOSIS — M25569 Pain in unspecified knee: Secondary | ICD-10-CM | POA: Diagnosis not present

## 2012-12-29 DIAGNOSIS — B351 Tinea unguium: Secondary | ICD-10-CM | POA: Diagnosis not present

## 2012-12-29 DIAGNOSIS — M79609 Pain in unspecified limb: Secondary | ICD-10-CM | POA: Diagnosis not present

## 2013-02-02 DIAGNOSIS — E669 Obesity, unspecified: Secondary | ICD-10-CM | POA: Diagnosis not present

## 2013-02-02 DIAGNOSIS — Z79899 Other long term (current) drug therapy: Secondary | ICD-10-CM | POA: Diagnosis not present

## 2013-02-02 DIAGNOSIS — E7800 Pure hypercholesterolemia, unspecified: Secondary | ICD-10-CM | POA: Diagnosis not present

## 2013-02-02 DIAGNOSIS — E1129 Type 2 diabetes mellitus with other diabetic kidney complication: Secondary | ICD-10-CM | POA: Diagnosis not present

## 2013-02-27 ENCOUNTER — Encounter: Payer: Self-pay | Admitting: *Deleted

## 2013-02-27 DIAGNOSIS — I10 Essential (primary) hypertension: Secondary | ICD-10-CM | POA: Insufficient documentation

## 2013-02-27 DIAGNOSIS — E119 Type 2 diabetes mellitus without complications: Secondary | ICD-10-CM | POA: Insufficient documentation

## 2013-02-27 DIAGNOSIS — B351 Tinea unguium: Secondary | ICD-10-CM | POA: Insufficient documentation

## 2013-03-09 ENCOUNTER — Encounter: Payer: Self-pay | Admitting: Podiatry

## 2013-03-09 ENCOUNTER — Ambulatory Visit (INDEPENDENT_AMBULATORY_CARE_PROVIDER_SITE_OTHER): Payer: Medicare Other | Admitting: Podiatry

## 2013-03-09 VITALS — Ht 68.0 in | Wt 194.0 lb

## 2013-03-09 DIAGNOSIS — M79609 Pain in unspecified limb: Secondary | ICD-10-CM | POA: Diagnosis not present

## 2013-03-09 DIAGNOSIS — B351 Tinea unguium: Secondary | ICD-10-CM

## 2013-03-09 NOTE — Progress Notes (Signed)
This patient presents today with a chief complaint of painful elongated toenails one through 5 bilaterally. Vital signs are stable he is alert and oriented x3. Pulses are palpable to the bilateral lower trimmed the. No calf pain. Nails demonstrate thick yellow dystrophic clinically mycotic nails 1 through 5 bilateral foot.  Assessment: Pain in limb secondary to onychomycosis 1 through 5 bilaterally.  Plan: Debridement of mycotic nails 1 through 5 bilateral he has a covered service.

## 2013-03-09 NOTE — Patient Instructions (Addendum)
Follow up with me in two months for Routine care.

## 2013-03-17 DIAGNOSIS — Z23 Encounter for immunization: Secondary | ICD-10-CM | POA: Diagnosis not present

## 2013-05-16 ENCOUNTER — Ambulatory Visit (INDEPENDENT_AMBULATORY_CARE_PROVIDER_SITE_OTHER): Payer: Medicare Other | Admitting: Podiatry

## 2013-05-16 ENCOUNTER — Encounter: Payer: Self-pay | Admitting: Podiatry

## 2013-05-16 VITALS — BP 162/81 | HR 79 | Resp 18

## 2013-05-16 DIAGNOSIS — B351 Tinea unguium: Secondary | ICD-10-CM

## 2013-05-16 DIAGNOSIS — M79609 Pain in unspecified limb: Secondary | ICD-10-CM

## 2013-05-16 NOTE — Progress Notes (Signed)
   Subjective:    Patient ID: Jose Kane, male    DOB: April 30, 1924, 79 y.o.   MRN: [id number]  HPI Comments: " just the toenails "     Review of Systems     Objective:   Physical Exam I have reviewed his past medical history medications and allergies pulses remain palpable bilateral. Nails are thick yellow dystrophic clinically mycotic and painful on palpation.        Assessment & Plan:  Assessment: Pain in limb secondary to onychomycosis 1 through 5 bilateral.  Plan: Debridement of nails 1 through 5 bilateral is a covered service secondary to pain. Followup with him in 3 months.

## 2013-05-25 DIAGNOSIS — E7800 Pure hypercholesterolemia, unspecified: Secondary | ICD-10-CM | POA: Diagnosis not present

## 2013-05-25 DIAGNOSIS — N058 Unspecified nephritic syndrome with other morphologic changes: Secondary | ICD-10-CM | POA: Diagnosis not present

## 2013-05-25 DIAGNOSIS — E1129 Type 2 diabetes mellitus with other diabetic kidney complication: Secondary | ICD-10-CM | POA: Diagnosis not present

## 2013-05-25 DIAGNOSIS — E669 Obesity, unspecified: Secondary | ICD-10-CM | POA: Diagnosis not present

## 2013-05-25 DIAGNOSIS — I1310 Hypertensive heart and chronic kidney disease without heart failure, with stage 1 through stage 4 chronic kidney disease, or unspecified chronic kidney disease: Secondary | ICD-10-CM | POA: Diagnosis not present

## 2013-05-25 DIAGNOSIS — N183 Chronic kidney disease, stage 3 unspecified: Secondary | ICD-10-CM | POA: Diagnosis not present

## 2013-07-18 ENCOUNTER — Ambulatory Visit (INDEPENDENT_AMBULATORY_CARE_PROVIDER_SITE_OTHER): Payer: Medicare Other | Admitting: Podiatry

## 2013-07-18 ENCOUNTER — Encounter: Payer: Self-pay | Admitting: Podiatry

## 2013-07-18 VITALS — BP 161/76 | HR 76 | Resp 12

## 2013-07-18 DIAGNOSIS — M79609 Pain in unspecified limb: Secondary | ICD-10-CM

## 2013-07-18 DIAGNOSIS — B351 Tinea unguium: Secondary | ICD-10-CM

## 2013-07-18 NOTE — Progress Notes (Signed)
   Subjective:    Patient ID: Jose Kane, male    DOB: 02/17/1924, 80 y.o.   MRN: [id number]  HPI Comments: '' TOENAILS TRIM.''     Review of Systems     Objective:   Physical Exam: Pulses are strongly palpable bilateral. Nails are thick yellow dystrophic lytic mycotic and painful palpation.        Assessment & Plan:  Assessment: Pain in limb secondary to onychomycosis 1 through 5 bilateral.  Plan: Debridement of nails thickness and length as a covered service secondary to pain.

## 2013-09-15 ENCOUNTER — Ambulatory Visit (INDEPENDENT_AMBULATORY_CARE_PROVIDER_SITE_OTHER): Payer: Medicare Other | Admitting: Podiatry

## 2013-09-15 DIAGNOSIS — B351 Tinea unguium: Secondary | ICD-10-CM

## 2013-09-15 DIAGNOSIS — M79609 Pain in unspecified limb: Secondary | ICD-10-CM

## 2013-09-15 NOTE — Progress Notes (Signed)
He presents today with a chief complaint of painful nails 1 through 5 bilateral.  Objective: Pulses are palpable bilateral. Nails are thick yellow dystrophic with mycotic and painful palpation.  Assessment: Pain in limb secondary to onychomycosis 1 through 5 bilateral.  Plan: Debridement of nails 1 through 5 bilateral is cover service secondary to pain.

## 2013-09-19 ENCOUNTER — Ambulatory Visit: Payer: Medicare Other | Admitting: Podiatry

## 2013-11-21 ENCOUNTER — Encounter: Payer: Self-pay | Admitting: Podiatry

## 2013-11-21 ENCOUNTER — Ambulatory Visit (INDEPENDENT_AMBULATORY_CARE_PROVIDER_SITE_OTHER): Payer: Medicare Other | Admitting: Podiatry

## 2013-11-21 VITALS — BP 141/79 | HR 76 | Resp 16

## 2013-11-21 DIAGNOSIS — B351 Tinea unguium: Secondary | ICD-10-CM | POA: Diagnosis not present

## 2013-11-21 DIAGNOSIS — M79609 Pain in unspecified limb: Secondary | ICD-10-CM | POA: Diagnosis not present

## 2013-11-21 NOTE — Patient Instructions (Signed)
Diabetes and Foot Care Diabetes may cause you to have problems because of poor blood supply (circulation) to your feet and legs. This may cause the skin on your feet to become thinner, break easier, and heal more slowly. Your skin may become dry, and the skin may peel and crack. You may also have nerve damage in your legs and feet causing decreased feeling in them. You may not notice minor injuries to your feet that could lead to infections or more serious problems. Taking care of your feet is one of the most important things you can do for yourself.  HOME CARE INSTRUCTIONS  Wear shoes at all times, even in the house. Do not go barefoot. Bare feet are easily injured.  Check your feet daily for blisters, cuts, and redness. If you cannot see the bottom of your feet, use a mirror or ask someone for help.  Wash your feet with warm water (do not use hot water) and mild soap. Then pat your feet and the areas between your toes until they are completely dry. Do not soak your feet as this can dry your skin.  Apply a moisturizing lotion or petroleum jelly (that does not contain alcohol and is unscented) to the skin on your feet and to dry, brittle toenails. Do not apply lotion between your toes.  Trim your toenails straight across. Do not dig under them or around the cuticle. File the edges of your nails with an emery board or nail file.  Do not cut corns or calluses or try to remove them with medicine.  Wear clean socks or stockings every day. Make sure they are not too tight. Do not wear knee-high stockings since they may decrease blood flow to your legs.  Wear shoes that fit properly and have enough cushioning. To break in new shoes, wear them for just a few hours a day. This prevents you from injuring your feet. Always look in your shoes before you put them on to be sure there are no objects inside.  Do not cross your legs. This may decrease the blood flow to your feet.  If you find a minor scrape,  cut, or break in the skin on your feet, keep it and the skin around it clean and dry. These areas may be cleansed with mild soap and water. Do not cleanse the area with peroxide, alcohol, or iodine.  When you remove an adhesive bandage, be sure not to damage the skin around it.  If you have a wound, look at it several times a day to make sure it is healing.  Do not use heating pads or hot water bottles. They may burn your skin. If you have lost feeling in your feet or legs, you may not know it is happening until it is too late.  Make sure your health care provider performs a complete foot exam at least annually or more often if you have foot problems. Report any cuts, sores, or bruises to your health care provider immediately. SEEK MEDICAL CARE IF:   You have an injury that is not healing.  You have cuts or breaks in the skin.  You have an ingrown nail.  You notice redness on your legs or feet.  You feel burning or tingling in your legs or feet.  You have pain or cramps in your legs and feet.  Your legs or feet are numb.  Your feet always feel cold. SEEK IMMEDIATE MEDICAL CARE IF:   There is increasing redness,   swelling, or pain in or around a wound.  There is a red line that goes up your leg.  Pus is coming from a wound.  You develop a fever or as directed by your health care provider.  You notice a bad smell coming from an ulcer or wound. Document Released: 05/22/2000 Document Revised: 01/25/2013 Document Reviewed: 11/01/2012 [employer] Patient Information 2014 [employer].  

## 2013-11-21 NOTE — Progress Notes (Signed)
He presents with a chief complaint of painfully thick mycotic nails bilateral.  Objective: Pulses are palpable bilateral. Nails are thick yellow dystrophic with mycotic and painful palpation.  Assessment: Pain in limb secondary to onychomycosis 1 through 5 bilateral.  Plan: Debridement of nails 1 through 5 bilateral covered service secondary to pain.

## 2013-12-04 DIAGNOSIS — E559 Vitamin D deficiency, unspecified: Secondary | ICD-10-CM | POA: Diagnosis not present

## 2013-12-04 DIAGNOSIS — E1129 Type 2 diabetes mellitus with other diabetic kidney complication: Secondary | ICD-10-CM | POA: Diagnosis not present

## 2013-12-04 DIAGNOSIS — E7800 Pure hypercholesterolemia, unspecified: Secondary | ICD-10-CM | POA: Diagnosis not present

## 2013-12-04 DIAGNOSIS — I1310 Hypertensive heart and chronic kidney disease without heart failure, with stage 1 through stage 4 chronic kidney disease, or unspecified chronic kidney disease: Secondary | ICD-10-CM | POA: Diagnosis not present

## 2013-12-04 DIAGNOSIS — E1165 Type 2 diabetes mellitus with hyperglycemia: Secondary | ICD-10-CM | POA: Diagnosis not present

## 2013-12-04 DIAGNOSIS — N183 Chronic kidney disease, stage 3 unspecified: Secondary | ICD-10-CM | POA: Diagnosis not present

## 2013-12-04 DIAGNOSIS — Z0000 Encounter for general adult medical examination without abnormal findings: Secondary | ICD-10-CM | POA: Diagnosis not present

## 2013-12-04 DIAGNOSIS — I440 Atrioventricular block, first degree: Secondary | ICD-10-CM | POA: Diagnosis not present

## 2013-12-04 DIAGNOSIS — N058 Unspecified nephritic syndrome with other morphologic changes: Secondary | ICD-10-CM | POA: Diagnosis not present

## 2013-12-15 DIAGNOSIS — M25569 Pain in unspecified knee: Secondary | ICD-10-CM | POA: Diagnosis not present

## 2013-12-15 DIAGNOSIS — M169 Osteoarthritis of hip, unspecified: Secondary | ICD-10-CM | POA: Diagnosis not present

## 2013-12-15 DIAGNOSIS — M1610 Unilateral primary osteoarthritis, unspecified hip: Secondary | ICD-10-CM | POA: Diagnosis not present

## 2014-01-23 ENCOUNTER — Ambulatory Visit (INDEPENDENT_AMBULATORY_CARE_PROVIDER_SITE_OTHER): Payer: Medicare Other | Admitting: Podiatry

## 2014-01-23 ENCOUNTER — Encounter: Payer: Self-pay | Admitting: Podiatry

## 2014-01-23 DIAGNOSIS — B351 Tinea unguium: Secondary | ICD-10-CM | POA: Diagnosis not present

## 2014-01-23 DIAGNOSIS — M79609 Pain in unspecified limb: Secondary | ICD-10-CM

## 2014-01-23 DIAGNOSIS — M79676 Pain in unspecified toe(s): Secondary | ICD-10-CM

## 2014-01-23 NOTE — Progress Notes (Signed)
He presents today chief complaint of painful elongated toenails one through 5 bilateral.  Objective: Pulses are palpable bilateral. Nails are thick yellow dystrophic with mycotic and painful palpation.  Assessment: Pain in limb secondary to onychomycosis 1 through 5 bilateral.  Plan: Debridement of nails 1 through 5 bilateral.

## 2014-03-06 DIAGNOSIS — Z23 Encounter for immunization: Secondary | ICD-10-CM | POA: Diagnosis not present

## 2014-03-29 ENCOUNTER — Ambulatory Visit (INDEPENDENT_AMBULATORY_CARE_PROVIDER_SITE_OTHER): Payer: Medicare Other | Admitting: Podiatry

## 2014-03-29 DIAGNOSIS — B351 Tinea unguium: Secondary | ICD-10-CM | POA: Diagnosis not present

## 2014-03-29 DIAGNOSIS — M79676 Pain in unspecified toe(s): Secondary | ICD-10-CM | POA: Diagnosis not present

## 2014-03-29 NOTE — Progress Notes (Signed)
Presents today chief complaint of painful elongated toenails.  Objective: Pulses are palpable bilateral nails are thick, yellow dystrophic onychomycosis and painful palpation.   Assessment: Onychomycosis with pain in limb.  Plan: Treatment of nails in thickness and length as covered service secondary to pain.  

## 2014-04-05 DIAGNOSIS — N08 Glomerular disorders in diseases classified elsewhere: Secondary | ICD-10-CM | POA: Diagnosis not present

## 2014-04-05 DIAGNOSIS — I129 Hypertensive chronic kidney disease with stage 1 through stage 4 chronic kidney disease, or unspecified chronic kidney disease: Secondary | ICD-10-CM | POA: Diagnosis not present

## 2014-04-05 DIAGNOSIS — E1122 Type 2 diabetes mellitus with diabetic chronic kidney disease: Secondary | ICD-10-CM | POA: Diagnosis not present

## 2014-04-05 DIAGNOSIS — Z96642 Presence of left artificial hip joint: Secondary | ICD-10-CM | POA: Diagnosis not present

## 2014-04-05 DIAGNOSIS — N183 Chronic kidney disease, stage 3 (moderate): Secondary | ICD-10-CM | POA: Diagnosis not present

## 2014-06-14 ENCOUNTER — Ambulatory Visit: Payer: Medicare Other | Admitting: Podiatry

## 2014-06-28 ENCOUNTER — Encounter: Payer: Self-pay | Admitting: Podiatry

## 2014-06-28 ENCOUNTER — Ambulatory Visit (INDEPENDENT_AMBULATORY_CARE_PROVIDER_SITE_OTHER): Payer: Medicare Other | Admitting: Podiatry

## 2014-06-28 DIAGNOSIS — B351 Tinea unguium: Secondary | ICD-10-CM | POA: Diagnosis not present

## 2014-06-28 DIAGNOSIS — M79673 Pain in unspecified foot: Secondary | ICD-10-CM

## 2014-06-28 NOTE — Progress Notes (Signed)
He presents today with chief complaint of painful elongated toenails.  Objective: His nails are thick yellow dystrophic with mycotic and painful palpation.  Assessment: Pain in limb secondary to onychomycosis 1 through 5 bilateral.  Plan: Plan debridement of nails 1 through 5 bilateral covered service secondary to pain follow up with him in 2 months. 

## 2014-08-30 ENCOUNTER — Encounter: Payer: Self-pay | Admitting: Podiatry

## 2014-08-30 ENCOUNTER — Ambulatory Visit (INDEPENDENT_AMBULATORY_CARE_PROVIDER_SITE_OTHER): Payer: Medicare Other | Admitting: Podiatry

## 2014-08-30 DIAGNOSIS — B351 Tinea unguium: Secondary | ICD-10-CM | POA: Diagnosis not present

## 2014-08-30 DIAGNOSIS — M79673 Pain in unspecified foot: Secondary | ICD-10-CM

## 2014-08-30 NOTE — Progress Notes (Signed)
He presents today with chief complaint of painful elongated toenails.  Objective: His nails are thick yellow dystrophic with mycotic and painful palpation.  Assessment: Pain in limb secondary to onychomycosis 1 through 5 bilateral.  Plan: Plan debridement of nails 1 through 5 bilateral covered service secondary to pain follow up with him in 2 months. 

## 2014-08-30 NOTE — Patient Instructions (Signed)
Diabetes and Foot Care Diabetes may cause you to have problems because of poor blood supply (circulation) to your feet and legs. This may cause the skin on your feet to become thinner, break easier, and heal more slowly. Your skin may become dry, and the skin may peel and crack. You may also have nerve damage in your legs and feet causing decreased feeling in them. You may not notice minor injuries to your feet that could lead to infections or more serious problems. Taking care of your feet is one of the most important things you can do for yourself.  HOME CARE INSTRUCTIONS  Wear shoes at all times, even in the house. Do not go barefoot. Bare feet are easily injured.  Check your feet daily for blisters, cuts, and redness. If you cannot see the bottom of your feet, use a mirror or ask someone for help.  Wash your feet with warm water (do not use hot water) and mild soap. Then pat your feet and the areas between your toes until they are completely dry. Do not soak your feet as this can dry your skin.  Apply a moisturizing lotion or petroleum jelly (that does not contain alcohol and is unscented) to the skin on your feet and to dry, brittle toenails. Do not apply lotion between your toes.  Trim your toenails straight across. Do not dig under them or around the cuticle. File the edges of your nails with an emery board or nail file.  Do not cut corns or calluses or try to remove them with medicine.  Wear clean socks or stockings every day. Make sure they are not too tight. Do not wear knee-high stockings since they may decrease blood flow to your legs.  Wear shoes that fit properly and have enough cushioning. To break in new shoes, wear them for just a few hours a day. This prevents you from injuring your feet. Always look in your shoes before you put them on to be sure there are no objects inside.  Do not cross your legs. This may decrease the blood flow to your feet.  If you find a minor scrape,  cut, or break in the skin on your feet, keep it and the skin around it clean and dry. These areas may be cleansed with mild soap and water. Do not cleanse the area with peroxide, alcohol, or iodine.  When you remove an adhesive bandage, be sure not to damage the skin around it.  If you have a wound, look at it several times a day to make sure it is healing.  Do not use heating pads or hot water bottles. They may burn your skin. If you have lost feeling in your feet or legs, you may not know it is happening until it is too late.  Make sure your health care provider performs a complete foot exam at least annually or more often if you have foot problems. Report any cuts, sores, or bruises to your health care provider immediately. SEEK MEDICAL CARE IF:   You have an injury that is not healing.  You have cuts or breaks in the skin.  You have an ingrown nail.  You notice redness on your legs or feet.  You feel burning or tingling in your legs or feet.  You have pain or cramps in your legs and feet.  Your legs or feet are numb.  Your feet always feel cold. SEEK IMMEDIATE MEDICAL CARE IF:   There is increasing redness,   swelling, or pain in or around a wound.  There is a red line that goes up your leg.  Pus is coming from a wound.  You develop a fever or as directed by your health care provider.  You notice a bad smell coming from an ulcer or wound. Document Released: 05/22/2000 Document Revised: 01/25/2013 Document Reviewed: 11/01/2012 Exit[employer] Patient Information 2015 [employer]. This information is not intended to replace advice given to you by your health care provider. Make sure you discuss any questions you have with your health care provider.  

## 2014-09-06 ENCOUNTER — Ambulatory Visit: Payer: Medicare Other | Admitting: Podiatry

## 2014-09-13 ENCOUNTER — Ambulatory Visit: Payer: Medicare Other | Admitting: Podiatry

## 2014-09-19 DIAGNOSIS — K5900 Constipation, unspecified: Secondary | ICD-10-CM | POA: Diagnosis not present

## 2014-09-19 DIAGNOSIS — I129 Hypertensive chronic kidney disease with stage 1 through stage 4 chronic kidney disease, or unspecified chronic kidney disease: Secondary | ICD-10-CM | POA: Diagnosis not present

## 2014-09-19 DIAGNOSIS — N08 Glomerular disorders in diseases classified elsewhere: Secondary | ICD-10-CM | POA: Diagnosis not present

## 2014-09-19 DIAGNOSIS — M79642 Pain in left hand: Secondary | ICD-10-CM | POA: Diagnosis not present

## 2014-09-19 DIAGNOSIS — E1122 Type 2 diabetes mellitus with diabetic chronic kidney disease: Secondary | ICD-10-CM | POA: Diagnosis not present

## 2014-09-19 DIAGNOSIS — R351 Nocturia: Secondary | ICD-10-CM | POA: Diagnosis not present

## 2014-09-19 DIAGNOSIS — N183 Chronic kidney disease, stage 3 (moderate): Secondary | ICD-10-CM | POA: Diagnosis not present

## 2014-10-25 ENCOUNTER — Ambulatory Visit: Payer: Medicare Other | Admitting: Podiatry

## 2014-11-01 ENCOUNTER — Ambulatory Visit (INDEPENDENT_AMBULATORY_CARE_PROVIDER_SITE_OTHER): Payer: Medicare Other | Admitting: Podiatry

## 2014-11-01 ENCOUNTER — Encounter: Payer: Self-pay | Admitting: Podiatry

## 2014-11-01 DIAGNOSIS — M79673 Pain in unspecified foot: Secondary | ICD-10-CM | POA: Diagnosis not present

## 2014-11-01 DIAGNOSIS — B351 Tinea unguium: Secondary | ICD-10-CM | POA: Diagnosis not present

## 2014-11-01 NOTE — Progress Notes (Signed)
He presents today with chief complaint of painful elongated toenails.  Objective: His nails are thick yellow dystrophic with mycotic and painful palpation.  Assessment: Pain in limb secondary to onychomycosis 1 through 5 bilateral.  Plan: Plan debridement of nails 1 through 5 bilateral covered service secondary to pain follow up with him in 2 months.

## 2015-01-03 ENCOUNTER — Encounter: Payer: Self-pay | Admitting: Podiatry

## 2015-01-03 ENCOUNTER — Ambulatory Visit (INDEPENDENT_AMBULATORY_CARE_PROVIDER_SITE_OTHER): Payer: Medicare Other | Admitting: Podiatry

## 2015-01-03 DIAGNOSIS — M79673 Pain in unspecified foot: Secondary | ICD-10-CM | POA: Diagnosis not present

## 2015-01-03 DIAGNOSIS — B351 Tinea unguium: Secondary | ICD-10-CM

## 2015-01-03 LAB — HM DIABETES FOOT EXAM

## 2015-01-03 NOTE — Progress Notes (Signed)
He presents today with chief complaint of painful elongated toenails.  Objective: His nails are thick yellow dystrophic with mycotic and painful palpation.  Assessment: Pain in limb secondary to onychomycosis 1 through 5 bilateral.  Plan: Plan debridement of nails 1 through 5 bilateral covered service secondary to pain follow up with him in 2 months. 

## 2015-01-17 DIAGNOSIS — I129 Hypertensive chronic kidney disease with stage 1 through stage 4 chronic kidney disease, or unspecified chronic kidney disease: Secondary | ICD-10-CM | POA: Diagnosis not present

## 2015-01-17 DIAGNOSIS — E559 Vitamin D deficiency, unspecified: Secondary | ICD-10-CM | POA: Diagnosis not present

## 2015-01-17 DIAGNOSIS — N183 Chronic kidney disease, stage 3 (moderate): Secondary | ICD-10-CM | POA: Diagnosis not present

## 2015-01-17 DIAGNOSIS — E1122 Type 2 diabetes mellitus with diabetic chronic kidney disease: Secondary | ICD-10-CM | POA: Diagnosis not present

## 2015-01-17 DIAGNOSIS — Z0000 Encounter for general adult medical examination without abnormal findings: Secondary | ICD-10-CM | POA: Diagnosis not present

## 2015-01-17 DIAGNOSIS — R351 Nocturia: Secondary | ICD-10-CM | POA: Diagnosis not present

## 2015-01-17 DIAGNOSIS — N08 Glomerular disorders in diseases classified elsewhere: Secondary | ICD-10-CM | POA: Diagnosis not present

## 2015-01-17 DIAGNOSIS — Z1389 Encounter for screening for other disorder: Secondary | ICD-10-CM | POA: Diagnosis not present

## 2015-03-14 ENCOUNTER — Encounter: Payer: Self-pay | Admitting: Podiatry

## 2015-03-14 ENCOUNTER — Ambulatory Visit (INDEPENDENT_AMBULATORY_CARE_PROVIDER_SITE_OTHER): Payer: Medicare Other | Admitting: Podiatry

## 2015-03-14 DIAGNOSIS — M79676 Pain in unspecified toe(s): Secondary | ICD-10-CM

## 2015-03-14 DIAGNOSIS — B351 Tinea unguium: Secondary | ICD-10-CM

## 2015-03-14 DIAGNOSIS — Z23 Encounter for immunization: Secondary | ICD-10-CM | POA: Diagnosis not present

## 2015-03-14 NOTE — Progress Notes (Signed)
He presents today to complaint of painful elongated toenails 1 through 5 bilateral foot.  Objective: Vital signs are stable alert and oriented 3. Pulses are strongly palpable bilateral. Neurologic sensorium is intact. His toenails are thick yellow dystrophic likely mycotic painful palpation 1 through 5 bilateral.  Assessment: Pain in limb secondary to onychomycosis 1 through 5 bilateral.  Plan: Debridement of nails 1 through 5 bilateral covered service secondary to pain. Follow-up with me in 2 months.  Arbutus Ped DPM

## 2015-04-15 DIAGNOSIS — M25552 Pain in left hip: Secondary | ICD-10-CM | POA: Diagnosis not present

## 2015-04-15 DIAGNOSIS — Z96642 Presence of left artificial hip joint: Secondary | ICD-10-CM | POA: Diagnosis not present

## 2015-05-21 ENCOUNTER — Ambulatory Visit: Payer: Medicare Other | Admitting: Podiatry

## 2015-05-21 DIAGNOSIS — N08 Glomerular disorders in diseases classified elsewhere: Secondary | ICD-10-CM | POA: Diagnosis not present

## 2015-05-21 DIAGNOSIS — I129 Hypertensive chronic kidney disease with stage 1 through stage 4 chronic kidney disease, or unspecified chronic kidney disease: Secondary | ICD-10-CM | POA: Diagnosis not present

## 2015-05-21 DIAGNOSIS — Z1389 Encounter for screening for other disorder: Secondary | ICD-10-CM | POA: Diagnosis not present

## 2015-05-21 DIAGNOSIS — E1122 Type 2 diabetes mellitus with diabetic chronic kidney disease: Secondary | ICD-10-CM | POA: Diagnosis not present

## 2015-05-21 DIAGNOSIS — F341 Dysthymic disorder: Secondary | ICD-10-CM | POA: Diagnosis not present

## 2015-05-21 DIAGNOSIS — N183 Chronic kidney disease, stage 3 (moderate): Secondary | ICD-10-CM | POA: Diagnosis not present

## 2015-05-23 ENCOUNTER — Ambulatory Visit (INDEPENDENT_AMBULATORY_CARE_PROVIDER_SITE_OTHER): Payer: Medicare Other | Admitting: Podiatry

## 2015-05-23 ENCOUNTER — Encounter: Payer: Self-pay | Admitting: Podiatry

## 2015-05-23 DIAGNOSIS — B351 Tinea unguium: Secondary | ICD-10-CM | POA: Diagnosis not present

## 2015-05-23 DIAGNOSIS — M79676 Pain in unspecified toe(s): Secondary | ICD-10-CM

## 2015-05-26 NOTE — Progress Notes (Signed)
He presents today chief complaint of painful elongated toenails.  Objective: vital signs are stable he is alert and oriented 3. His toenails are thick yellow dystrophic onychomycotic and painful palpation. Pulses are palpable.   Assessment: pain in limb secondary to onychomycosis bilateral.  Plan: debridement of toenails 1 through 5 bilateral. Follow up with him  In 2 months.

## 2015-07-25 ENCOUNTER — Encounter: Payer: Self-pay | Admitting: Podiatry

## 2015-07-25 ENCOUNTER — Ambulatory Visit (INDEPENDENT_AMBULATORY_CARE_PROVIDER_SITE_OTHER): Payer: Medicare Other | Admitting: Podiatry

## 2015-07-25 DIAGNOSIS — B351 Tinea unguium: Secondary | ICD-10-CM

## 2015-07-25 DIAGNOSIS — M79676 Pain in unspecified toe(s): Secondary | ICD-10-CM

## 2015-07-27 NOTE — Progress Notes (Signed)
He presents today for a diabetic evaluation of his feet and have his nails trimmed.  Objective: Pulses are palpable neurologic sensorium is intact. Toenails are thick yellow dystrophic onychomycotic. Painful on palpation as well as debridement.  Assessment: Diabetes mellitus non-complicated. Pain in limb secondary to onychomycosis.  Plan: Debridement of toenails 1 through 5 bilateral.

## 2015-08-22 DIAGNOSIS — N183 Chronic kidney disease, stage 3 (moderate): Secondary | ICD-10-CM | POA: Diagnosis not present

## 2015-08-22 DIAGNOSIS — I129 Hypertensive chronic kidney disease with stage 1 through stage 4 chronic kidney disease, or unspecified chronic kidney disease: Secondary | ICD-10-CM | POA: Diagnosis not present

## 2015-08-22 DIAGNOSIS — N08 Glomerular disorders in diseases classified elsewhere: Secondary | ICD-10-CM | POA: Diagnosis not present

## 2015-08-22 DIAGNOSIS — E1122 Type 2 diabetes mellitus with diabetic chronic kidney disease: Secondary | ICD-10-CM | POA: Diagnosis not present

## 2015-09-17 DIAGNOSIS — H02834 Dermatochalasis of left upper eyelid: Secondary | ICD-10-CM | POA: Diagnosis not present

## 2015-09-17 DIAGNOSIS — H43813 Vitreous degeneration, bilateral: Secondary | ICD-10-CM | POA: Diagnosis not present

## 2015-09-17 DIAGNOSIS — H02831 Dermatochalasis of right upper eyelid: Secondary | ICD-10-CM | POA: Diagnosis not present

## 2015-09-17 DIAGNOSIS — H1851 Endothelial corneal dystrophy: Secondary | ICD-10-CM | POA: Diagnosis not present

## 2015-09-17 DIAGNOSIS — E119 Type 2 diabetes mellitus without complications: Secondary | ICD-10-CM | POA: Diagnosis not present

## 2015-09-17 DIAGNOSIS — H35371 Puckering of macula, right eye: Secondary | ICD-10-CM | POA: Diagnosis not present

## 2015-09-17 DIAGNOSIS — H31091 Other chorioretinal scars, right eye: Secondary | ICD-10-CM | POA: Diagnosis not present

## 2015-09-17 DIAGNOSIS — Z961 Presence of intraocular lens: Secondary | ICD-10-CM | POA: Diagnosis not present

## 2015-09-26 ENCOUNTER — Encounter: Payer: Self-pay | Admitting: Podiatry

## 2015-09-26 ENCOUNTER — Ambulatory Visit (INDEPENDENT_AMBULATORY_CARE_PROVIDER_SITE_OTHER): Payer: Medicare Other | Admitting: Podiatry

## 2015-09-26 DIAGNOSIS — B351 Tinea unguium: Secondary | ICD-10-CM | POA: Diagnosis not present

## 2015-09-26 DIAGNOSIS — M79676 Pain in unspecified toe(s): Secondary | ICD-10-CM

## 2015-09-28 NOTE — Progress Notes (Signed)
He presents today with a chief complaint of painful elongated toenails 1 through 5 bilateral.  Objective: Vital signs are stable alert and oriented 3 pulses are palpable. His toenails are thick yellow dystrophic onychomycotic no open lesions or wounds. Feet are clean and healthy.  Assessment: Pain in limb secondary to onychomycosis bilateral.  Plan: Debridement of toenails 1 through 5 bilateral covered service secondary to pain follow up with him in 2 months.

## 2015-11-28 ENCOUNTER — Ambulatory Visit (INDEPENDENT_AMBULATORY_CARE_PROVIDER_SITE_OTHER): Payer: Medicare Other | Admitting: Podiatry

## 2015-11-28 ENCOUNTER — Encounter: Payer: Self-pay | Admitting: Podiatry

## 2015-11-28 DIAGNOSIS — B351 Tinea unguium: Secondary | ICD-10-CM | POA: Diagnosis not present

## 2015-11-28 DIAGNOSIS — M79676 Pain in unspecified toe(s): Secondary | ICD-10-CM | POA: Diagnosis not present

## 2015-11-28 NOTE — Progress Notes (Signed)
He presents today with chief complaint of painful elongated toenails 1 through 5 bilateral.  Objective: Vital signs are stable he is alert and oriented 3. Pulses are palpable. Neurologic sensorium is intact. Toenails are thick yellow dystrophic with mycotic and painful palpation.  Assessment: Pain limb secondary to onychomycosis.  Plan: Debridement of toenails 1 through 5 bilateral. Follow up with him in 2 months.

## 2015-12-04 DIAGNOSIS — N183 Chronic kidney disease, stage 3 (moderate): Secondary | ICD-10-CM | POA: Diagnosis not present

## 2015-12-04 DIAGNOSIS — N08 Glomerular disorders in diseases classified elsewhere: Secondary | ICD-10-CM | POA: Diagnosis not present

## 2015-12-04 DIAGNOSIS — I129 Hypertensive chronic kidney disease with stage 1 through stage 4 chronic kidney disease, or unspecified chronic kidney disease: Secondary | ICD-10-CM | POA: Diagnosis not present

## 2015-12-04 DIAGNOSIS — E1122 Type 2 diabetes mellitus with diabetic chronic kidney disease: Secondary | ICD-10-CM | POA: Diagnosis not present

## 2015-12-04 DIAGNOSIS — M109 Gout, unspecified: Secondary | ICD-10-CM | POA: Diagnosis not present

## 2016-01-06 DIAGNOSIS — M109 Gout, unspecified: Secondary | ICD-10-CM | POA: Diagnosis not present

## 2016-01-06 DIAGNOSIS — J309 Allergic rhinitis, unspecified: Secondary | ICD-10-CM | POA: Diagnosis not present

## 2016-01-06 DIAGNOSIS — Z87891 Personal history of nicotine dependence: Secondary | ICD-10-CM | POA: Diagnosis not present

## 2016-01-30 ENCOUNTER — Ambulatory Visit (INDEPENDENT_AMBULATORY_CARE_PROVIDER_SITE_OTHER): Payer: Medicare Other | Admitting: Podiatry

## 2016-01-30 ENCOUNTER — Encounter: Payer: Self-pay | Admitting: Podiatry

## 2016-01-30 DIAGNOSIS — M79676 Pain in unspecified toe(s): Secondary | ICD-10-CM

## 2016-01-30 DIAGNOSIS — B351 Tinea unguium: Secondary | ICD-10-CM

## 2016-01-30 NOTE — Progress Notes (Signed)
He presents today with a chief complaint of painful elongated toenails 1 through 5 bilateral.  Objective: Remains palpable bilateral. His toenails are thick yellow dystrophic and mycotic and painful to palpation.  Assessment: Pain in limb secondary to onychomycosis 1 through 5 bilateral.  Plan: Debridement of toenails 1 through 5 bilateral. Follow up with him in 2 months

## 2016-02-11 DIAGNOSIS — Z0000 Encounter for general adult medical examination without abnormal findings: Secondary | ICD-10-CM | POA: Diagnosis not present

## 2016-02-11 DIAGNOSIS — R413 Other amnesia: Secondary | ICD-10-CM | POA: Diagnosis not present

## 2016-02-11 DIAGNOSIS — N08 Glomerular disorders in diseases classified elsewhere: Secondary | ICD-10-CM | POA: Diagnosis not present

## 2016-02-11 DIAGNOSIS — E559 Vitamin D deficiency, unspecified: Secondary | ICD-10-CM | POA: Diagnosis not present

## 2016-02-11 DIAGNOSIS — E1122 Type 2 diabetes mellitus with diabetic chronic kidney disease: Secondary | ICD-10-CM | POA: Diagnosis not present

## 2016-02-11 DIAGNOSIS — I129 Hypertensive chronic kidney disease with stage 1 through stage 4 chronic kidney disease, or unspecified chronic kidney disease: Secondary | ICD-10-CM | POA: Diagnosis not present

## 2016-02-11 DIAGNOSIS — N183 Chronic kidney disease, stage 3 (moderate): Secondary | ICD-10-CM | POA: Diagnosis not present

## 2016-02-25 DIAGNOSIS — Z23 Encounter for immunization: Secondary | ICD-10-CM | POA: Diagnosis not present

## 2016-03-09 DIAGNOSIS — Z23 Encounter for immunization: Secondary | ICD-10-CM | POA: Diagnosis not present

## 2016-03-09 DIAGNOSIS — I129 Hypertensive chronic kidney disease with stage 1 through stage 4 chronic kidney disease, or unspecified chronic kidney disease: Secondary | ICD-10-CM | POA: Diagnosis not present

## 2016-03-09 DIAGNOSIS — N183 Chronic kidney disease, stage 3 (moderate): Secondary | ICD-10-CM | POA: Diagnosis not present

## 2016-04-02 ENCOUNTER — Ambulatory Visit (INDEPENDENT_AMBULATORY_CARE_PROVIDER_SITE_OTHER): Payer: Medicare Other | Admitting: Podiatry

## 2016-04-02 ENCOUNTER — Encounter: Payer: Self-pay | Admitting: Podiatry

## 2016-04-02 DIAGNOSIS — B351 Tinea unguium: Secondary | ICD-10-CM | POA: Diagnosis not present

## 2016-04-02 DIAGNOSIS — M79676 Pain in unspecified toe(s): Secondary | ICD-10-CM | POA: Diagnosis not present

## 2016-04-04 NOTE — Progress Notes (Signed)
He presents today with chief complaint of painful elongated toenails.  Objective: Toenails are thick yellow dystrophic clinic mycotic painful upon debridement.  Assessment: Pain limb secondary onychomycosis.  Plan: Debridement of toenails 1 through 5 bilateral.  Follow-up with him in 2 months.

## 2016-06-04 ENCOUNTER — Ambulatory Visit: Payer: Medicare Other | Admitting: Podiatry

## 2016-06-09 ENCOUNTER — Ambulatory Visit (INDEPENDENT_AMBULATORY_CARE_PROVIDER_SITE_OTHER): Payer: Medicare Other | Admitting: Podiatry

## 2016-06-09 ENCOUNTER — Encounter: Payer: Self-pay | Admitting: Podiatry

## 2016-06-09 DIAGNOSIS — B351 Tinea unguium: Secondary | ICD-10-CM | POA: Diagnosis not present

## 2016-06-09 DIAGNOSIS — M79676 Pain in unspecified toe(s): Secondary | ICD-10-CM | POA: Diagnosis not present

## 2016-06-10 NOTE — Progress Notes (Signed)
He presents today with a chief complaint of painful elongated toenails.  Objective: Toenails are thick yellow dystrophic with mycotic painful palpation. Pulses remain palpable. No open lesions or wounds are noted.  Assessment: Pain in limb Jose Kane onychomycosis.  Plan: Debridement of nails 1 through 5 bilateral.

## 2016-07-08 DIAGNOSIS — N08 Glomerular disorders in diseases classified elsewhere: Secondary | ICD-10-CM | POA: Diagnosis not present

## 2016-07-08 DIAGNOSIS — E1122 Type 2 diabetes mellitus with diabetic chronic kidney disease: Secondary | ICD-10-CM | POA: Diagnosis not present

## 2016-07-08 DIAGNOSIS — I129 Hypertensive chronic kidney disease with stage 1 through stage 4 chronic kidney disease, or unspecified chronic kidney disease: Secondary | ICD-10-CM | POA: Diagnosis not present

## 2016-07-08 DIAGNOSIS — N183 Chronic kidney disease, stage 3 (moderate): Secondary | ICD-10-CM | POA: Diagnosis not present

## 2016-08-11 ENCOUNTER — Encounter: Payer: Self-pay | Admitting: Podiatry

## 2016-08-11 ENCOUNTER — Ambulatory Visit (INDEPENDENT_AMBULATORY_CARE_PROVIDER_SITE_OTHER): Payer: Medicare Other | Admitting: Podiatry

## 2016-08-11 DIAGNOSIS — M79676 Pain in unspecified toe(s): Secondary | ICD-10-CM

## 2016-08-11 DIAGNOSIS — B351 Tinea unguium: Secondary | ICD-10-CM | POA: Diagnosis not present

## 2016-08-11 NOTE — Progress Notes (Signed)
He presents today with a chief complaint of painful elongated thickened toenails.  Objective: Vital signs are stable alert 3 pulses are palpable. He has a thick yellow dystrophic with mycotic and painful palpation.  Assessment: Pain in limb secondary to onychomycosis.  Plan: Debridement of toenails 1 through 5 bilateral.

## 2016-09-21 DIAGNOSIS — H02831 Dermatochalasis of right upper eyelid: Secondary | ICD-10-CM | POA: Diagnosis not present

## 2016-09-21 DIAGNOSIS — Z961 Presence of intraocular lens: Secondary | ICD-10-CM | POA: Diagnosis not present

## 2016-09-21 DIAGNOSIS — H1851 Endothelial corneal dystrophy: Secondary | ICD-10-CM | POA: Diagnosis not present

## 2016-09-21 DIAGNOSIS — H35371 Puckering of macula, right eye: Secondary | ICD-10-CM | POA: Diagnosis not present

## 2016-09-21 DIAGNOSIS — H353131 Nonexudative age-related macular degeneration, bilateral, early dry stage: Secondary | ICD-10-CM | POA: Diagnosis not present

## 2016-09-21 DIAGNOSIS — H02834 Dermatochalasis of left upper eyelid: Secondary | ICD-10-CM | POA: Diagnosis not present

## 2016-09-21 DIAGNOSIS — H43813 Vitreous degeneration, bilateral: Secondary | ICD-10-CM | POA: Diagnosis not present

## 2016-09-21 DIAGNOSIS — E119 Type 2 diabetes mellitus without complications: Secondary | ICD-10-CM | POA: Diagnosis not present

## 2016-09-21 DIAGNOSIS — H31091 Other chorioretinal scars, right eye: Secondary | ICD-10-CM | POA: Diagnosis not present

## 2016-10-15 ENCOUNTER — Ambulatory Visit: Payer: Medicare Other | Admitting: Podiatry

## 2016-10-22 ENCOUNTER — Encounter: Payer: Self-pay | Admitting: Podiatry

## 2016-10-22 ENCOUNTER — Ambulatory Visit (INDEPENDENT_AMBULATORY_CARE_PROVIDER_SITE_OTHER): Payer: Medicare Other | Admitting: Podiatry

## 2016-10-22 DIAGNOSIS — M79676 Pain in unspecified toe(s): Secondary | ICD-10-CM | POA: Diagnosis not present

## 2016-10-22 DIAGNOSIS — B351 Tinea unguium: Secondary | ICD-10-CM | POA: Diagnosis not present

## 2016-10-24 NOTE — Progress Notes (Signed)
He presents today for chief complaint of painful elongated toenails 1 through 5 bilateral.  Objective evaluation demonstrates thick yellow dystrophic clinic mycotic nails no open lesions or wounds. Pulses are palpable.  Assessment: Pain in limb secondary to onychomycosis.  Plan: Debridement toenails 1 through 5 bilateral.

## 2016-12-22 ENCOUNTER — Encounter: Payer: Self-pay | Admitting: Podiatry

## 2016-12-22 ENCOUNTER — Ambulatory Visit (INDEPENDENT_AMBULATORY_CARE_PROVIDER_SITE_OTHER): Payer: Medicare Other | Admitting: Podiatry

## 2016-12-22 DIAGNOSIS — M79676 Pain in unspecified toe(s): Secondary | ICD-10-CM | POA: Diagnosis not present

## 2016-12-22 DIAGNOSIS — B351 Tinea unguium: Secondary | ICD-10-CM

## 2016-12-22 NOTE — Progress Notes (Signed)
He presents today with chief complaint of painful elongated toenails 1 through 5 bilateral.  Objective: Vital signs are stable and alert and oriented 3 pulses are palpable bilateral. Toenails are long thick yellow dystrophic clinically mycotic.  Assessment: Painful onychomycosis.  Plan: Debridement of toenails 1 through 5 bilateral.

## 2016-12-29 ENCOUNTER — Other Ambulatory Visit: Payer: Self-pay

## 2017-02-23 ENCOUNTER — Ambulatory Visit (INDEPENDENT_AMBULATORY_CARE_PROVIDER_SITE_OTHER): Payer: Medicare Other | Admitting: Podiatry

## 2017-02-23 ENCOUNTER — Encounter: Payer: Self-pay | Admitting: Podiatry

## 2017-02-23 DIAGNOSIS — B351 Tinea unguium: Secondary | ICD-10-CM | POA: Diagnosis not present

## 2017-02-23 DIAGNOSIS — M79676 Pain in unspecified toe(s): Secondary | ICD-10-CM | POA: Diagnosis not present

## 2017-02-23 NOTE — Progress Notes (Signed)
Presents today with chief complaint of painful elongated toenails.  Joint: Toenails are thick yellow dystrophic with mycotic pulses are palpable no Pain.  Assessment: Pain and secondary onychomycosis.  Plan: Debridement of toenails 1 through 5 bilateral.

## 2017-03-16 DIAGNOSIS — N183 Chronic kidney disease, stage 3 (moderate): Secondary | ICD-10-CM | POA: Diagnosis not present

## 2017-03-16 DIAGNOSIS — Z23 Encounter for immunization: Secondary | ICD-10-CM | POA: Diagnosis not present

## 2017-03-16 DIAGNOSIS — I129 Hypertensive chronic kidney disease with stage 1 through stage 4 chronic kidney disease, or unspecified chronic kidney disease: Secondary | ICD-10-CM | POA: Diagnosis not present

## 2017-03-16 DIAGNOSIS — N08 Glomerular disorders in diseases classified elsewhere: Secondary | ICD-10-CM | POA: Diagnosis not present

## 2017-03-16 DIAGNOSIS — Z0000 Encounter for general adult medical examination without abnormal findings: Secondary | ICD-10-CM | POA: Diagnosis not present

## 2017-03-16 DIAGNOSIS — E559 Vitamin D deficiency, unspecified: Secondary | ICD-10-CM | POA: Diagnosis not present

## 2017-03-16 DIAGNOSIS — E1122 Type 2 diabetes mellitus with diabetic chronic kidney disease: Secondary | ICD-10-CM | POA: Diagnosis not present

## 2017-04-27 ENCOUNTER — Encounter: Payer: Self-pay | Admitting: Podiatry

## 2017-04-27 ENCOUNTER — Ambulatory Visit (INDEPENDENT_AMBULATORY_CARE_PROVIDER_SITE_OTHER): Payer: Medicare Other | Admitting: Podiatry

## 2017-04-27 DIAGNOSIS — B351 Tinea unguium: Secondary | ICD-10-CM

## 2017-04-27 DIAGNOSIS — M79676 Pain in unspecified toe(s): Secondary | ICD-10-CM

## 2017-04-27 NOTE — Progress Notes (Addendum)
He presents today with a chief complaint of painful elongated toenails.  Objective: Toenails are long thick yellow dystrophic clinically mycotic.  Assessment: Onychomycosis painful in nature.  Plan: Debridement of toenails bilateral.

## 2017-07-01 ENCOUNTER — Encounter: Payer: Self-pay | Admitting: Podiatry

## 2017-07-01 ENCOUNTER — Ambulatory Visit (INDEPENDENT_AMBULATORY_CARE_PROVIDER_SITE_OTHER): Payer: Medicare Other | Admitting: Podiatry

## 2017-07-01 DIAGNOSIS — M79676 Pain in unspecified toe(s): Secondary | ICD-10-CM

## 2017-07-01 DIAGNOSIS — B351 Tinea unguium: Secondary | ICD-10-CM

## 2017-07-01 NOTE — Progress Notes (Signed)
He presents today chief complaint of painful elongated toenails.  Objective: Pulses are palpable toenails are long thick yellow dystrophic clinically mycotic no open lesions or wounds are noted.  Assessment: Pain in limb secondary to onychomycosis.  Plan: Debridement of toenails 1 through 5 bilateral.  Follow-up with him as needed.

## 2017-07-13 DIAGNOSIS — I129 Hypertensive chronic kidney disease with stage 1 through stage 4 chronic kidney disease, or unspecified chronic kidney disease: Secondary | ICD-10-CM | POA: Diagnosis not present

## 2017-07-13 DIAGNOSIS — N183 Chronic kidney disease, stage 3 (moderate): Secondary | ICD-10-CM | POA: Diagnosis not present

## 2017-07-13 DIAGNOSIS — N08 Glomerular disorders in diseases classified elsewhere: Secondary | ICD-10-CM | POA: Diagnosis not present

## 2017-07-13 DIAGNOSIS — E1122 Type 2 diabetes mellitus with diabetic chronic kidney disease: Secondary | ICD-10-CM | POA: Diagnosis not present

## 2017-08-31 ENCOUNTER — Ambulatory Visit: Payer: PRIVATE HEALTH INSURANCE | Admitting: Podiatry

## 2017-09-16 DIAGNOSIS — H547 Unspecified visual loss: Secondary | ICD-10-CM | POA: Diagnosis not present

## 2017-09-16 DIAGNOSIS — E119 Type 2 diabetes mellitus without complications: Secondary | ICD-10-CM | POA: Diagnosis not present

## 2017-09-16 DIAGNOSIS — N183 Chronic kidney disease, stage 3 (moderate): Secondary | ICD-10-CM | POA: Diagnosis not present

## 2017-09-16 DIAGNOSIS — Z125 Encounter for screening for malignant neoplasm of prostate: Secondary | ICD-10-CM | POA: Diagnosis not present

## 2017-09-16 DIAGNOSIS — I10 Essential (primary) hypertension: Secondary | ICD-10-CM | POA: Diagnosis not present

## 2017-09-16 DIAGNOSIS — K219 Gastro-esophageal reflux disease without esophagitis: Secondary | ICD-10-CM | POA: Diagnosis not present

## 2017-09-16 DIAGNOSIS — B351 Tinea unguium: Secondary | ICD-10-CM | POA: Diagnosis not present

## 2017-09-16 DIAGNOSIS — N400 Enlarged prostate without lower urinary tract symptoms: Secondary | ICD-10-CM | POA: Diagnosis not present

## 2017-09-28 DIAGNOSIS — B351 Tinea unguium: Secondary | ICD-10-CM | POA: Diagnosis not present

## 2017-09-28 DIAGNOSIS — E119 Type 2 diabetes mellitus without complications: Secondary | ICD-10-CM | POA: Diagnosis not present

## 2017-09-28 DIAGNOSIS — L609 Nail disorder, unspecified: Secondary | ICD-10-CM | POA: Diagnosis not present

## 2017-09-28 DIAGNOSIS — L603 Nail dystrophy: Secondary | ICD-10-CM | POA: Diagnosis not present

## 2017-09-28 DIAGNOSIS — L608 Other nail disorders: Secondary | ICD-10-CM | POA: Diagnosis not present

## 2017-10-26 DIAGNOSIS — M109 Gout, unspecified: Secondary | ICD-10-CM | POA: Diagnosis not present

## 2017-10-26 DIAGNOSIS — M169 Osteoarthritis of hip, unspecified: Secondary | ICD-10-CM | POA: Diagnosis not present

## 2017-10-26 DIAGNOSIS — G3184 Mild cognitive impairment, so stated: Secondary | ICD-10-CM | POA: Diagnosis not present

## 2017-10-26 DIAGNOSIS — I10 Essential (primary) hypertension: Secondary | ICD-10-CM | POA: Diagnosis not present

## 2017-11-23 DIAGNOSIS — E119 Type 2 diabetes mellitus without complications: Secondary | ICD-10-CM | POA: Diagnosis not present

## 2017-11-23 DIAGNOSIS — L608 Other nail disorders: Secondary | ICD-10-CM | POA: Diagnosis not present

## 2017-12-25 DIAGNOSIS — J309 Allergic rhinitis, unspecified: Secondary | ICD-10-CM | POA: Diagnosis not present

## 2017-12-25 DIAGNOSIS — J329 Chronic sinusitis, unspecified: Secondary | ICD-10-CM | POA: Diagnosis not present

## 2018-01-13 DIAGNOSIS — R2689 Other abnormalities of gait and mobility: Secondary | ICD-10-CM | POA: Diagnosis not present

## 2018-01-13 DIAGNOSIS — M109 Gout, unspecified: Secondary | ICD-10-CM | POA: Diagnosis not present

## 2018-01-13 DIAGNOSIS — R454 Irritability and anger: Secondary | ICD-10-CM | POA: Diagnosis not present

## 2018-01-13 DIAGNOSIS — E119 Type 2 diabetes mellitus without complications: Secondary | ICD-10-CM | POA: Diagnosis not present

## 2018-01-13 DIAGNOSIS — M79604 Pain in right leg: Secondary | ICD-10-CM | POA: Diagnosis not present

## 2018-01-13 DIAGNOSIS — H9190 Unspecified hearing loss, unspecified ear: Secondary | ICD-10-CM | POA: Diagnosis not present

## 2018-01-13 DIAGNOSIS — R0981 Nasal congestion: Secondary | ICD-10-CM | POA: Diagnosis not present

## 2018-01-29 DIAGNOSIS — G3184 Mild cognitive impairment, so stated: Secondary | ICD-10-CM | POA: Diagnosis not present

## 2018-01-29 DIAGNOSIS — N183 Chronic kidney disease, stage 3 (moderate): Secondary | ICD-10-CM | POA: Diagnosis not present

## 2018-01-29 DIAGNOSIS — Z7984 Long term (current) use of oral hypoglycemic drugs: Secondary | ICD-10-CM | POA: Diagnosis not present

## 2018-01-29 DIAGNOSIS — M169 Osteoarthritis of hip, unspecified: Secondary | ICD-10-CM | POA: Diagnosis not present

## 2018-01-29 DIAGNOSIS — I129 Hypertensive chronic kidney disease with stage 1 through stage 4 chronic kidney disease, or unspecified chronic kidney disease: Secondary | ICD-10-CM | POA: Diagnosis not present

## 2018-01-29 DIAGNOSIS — M1611 Unilateral primary osteoarthritis, right hip: Secondary | ICD-10-CM | POA: Diagnosis not present

## 2018-01-29 DIAGNOSIS — Z9181 History of falling: Secondary | ICD-10-CM | POA: Diagnosis not present

## 2018-01-29 DIAGNOSIS — J019 Acute sinusitis, unspecified: Secondary | ICD-10-CM | POA: Diagnosis not present

## 2018-01-29 DIAGNOSIS — E1122 Type 2 diabetes mellitus with diabetic chronic kidney disease: Secondary | ICD-10-CM | POA: Diagnosis not present

## 2018-01-29 DIAGNOSIS — R350 Frequency of micturition: Secondary | ICD-10-CM | POA: Diagnosis not present

## 2018-01-29 DIAGNOSIS — Z96642 Presence of left artificial hip joint: Secondary | ICD-10-CM | POA: Diagnosis not present

## 2018-01-29 DIAGNOSIS — N401 Enlarged prostate with lower urinary tract symptoms: Secondary | ICD-10-CM | POA: Diagnosis not present

## 2018-01-31 DIAGNOSIS — N183 Chronic kidney disease, stage 3 (moderate): Secondary | ICD-10-CM | POA: Diagnosis not present

## 2018-01-31 DIAGNOSIS — J019 Acute sinusitis, unspecified: Secondary | ICD-10-CM | POA: Diagnosis not present

## 2018-01-31 DIAGNOSIS — M1611 Unilateral primary osteoarthritis, right hip: Secondary | ICD-10-CM | POA: Diagnosis not present

## 2018-01-31 DIAGNOSIS — I129 Hypertensive chronic kidney disease with stage 1 through stage 4 chronic kidney disease, or unspecified chronic kidney disease: Secondary | ICD-10-CM | POA: Diagnosis not present

## 2018-01-31 DIAGNOSIS — E1122 Type 2 diabetes mellitus with diabetic chronic kidney disease: Secondary | ICD-10-CM | POA: Diagnosis not present

## 2018-01-31 DIAGNOSIS — Z96642 Presence of left artificial hip joint: Secondary | ICD-10-CM | POA: Diagnosis not present

## 2018-02-01 DIAGNOSIS — I129 Hypertensive chronic kidney disease with stage 1 through stage 4 chronic kidney disease, or unspecified chronic kidney disease: Secondary | ICD-10-CM | POA: Diagnosis not present

## 2018-02-01 DIAGNOSIS — Z96642 Presence of left artificial hip joint: Secondary | ICD-10-CM | POA: Diagnosis not present

## 2018-02-01 DIAGNOSIS — M1611 Unilateral primary osteoarthritis, right hip: Secondary | ICD-10-CM | POA: Diagnosis not present

## 2018-02-01 DIAGNOSIS — N183 Chronic kidney disease, stage 3 (moderate): Secondary | ICD-10-CM | POA: Diagnosis not present

## 2018-02-01 DIAGNOSIS — E1122 Type 2 diabetes mellitus with diabetic chronic kidney disease: Secondary | ICD-10-CM | POA: Diagnosis not present

## 2018-02-01 DIAGNOSIS — J019 Acute sinusitis, unspecified: Secondary | ICD-10-CM | POA: Diagnosis not present

## 2018-02-03 DIAGNOSIS — I129 Hypertensive chronic kidney disease with stage 1 through stage 4 chronic kidney disease, or unspecified chronic kidney disease: Secondary | ICD-10-CM | POA: Diagnosis not present

## 2018-02-03 DIAGNOSIS — M1611 Unilateral primary osteoarthritis, right hip: Secondary | ICD-10-CM | POA: Diagnosis not present

## 2018-02-03 DIAGNOSIS — N183 Chronic kidney disease, stage 3 (moderate): Secondary | ICD-10-CM | POA: Diagnosis not present

## 2018-02-03 DIAGNOSIS — J019 Acute sinusitis, unspecified: Secondary | ICD-10-CM | POA: Diagnosis not present

## 2018-02-03 DIAGNOSIS — Z96642 Presence of left artificial hip joint: Secondary | ICD-10-CM | POA: Diagnosis not present

## 2018-02-03 DIAGNOSIS — E1122 Type 2 diabetes mellitus with diabetic chronic kidney disease: Secondary | ICD-10-CM | POA: Diagnosis not present

## 2018-02-04 DIAGNOSIS — N183 Chronic kidney disease, stage 3 (moderate): Secondary | ICD-10-CM | POA: Diagnosis not present

## 2018-02-04 DIAGNOSIS — M1611 Unilateral primary osteoarthritis, right hip: Secondary | ICD-10-CM | POA: Diagnosis not present

## 2018-02-04 DIAGNOSIS — Z96642 Presence of left artificial hip joint: Secondary | ICD-10-CM | POA: Diagnosis not present

## 2018-02-04 DIAGNOSIS — J019 Acute sinusitis, unspecified: Secondary | ICD-10-CM | POA: Diagnosis not present

## 2018-02-04 DIAGNOSIS — E1122 Type 2 diabetes mellitus with diabetic chronic kidney disease: Secondary | ICD-10-CM | POA: Diagnosis not present

## 2018-02-04 DIAGNOSIS — I129 Hypertensive chronic kidney disease with stage 1 through stage 4 chronic kidney disease, or unspecified chronic kidney disease: Secondary | ICD-10-CM | POA: Diagnosis not present

## 2018-02-08 DIAGNOSIS — I129 Hypertensive chronic kidney disease with stage 1 through stage 4 chronic kidney disease, or unspecified chronic kidney disease: Secondary | ICD-10-CM | POA: Diagnosis not present

## 2018-02-08 DIAGNOSIS — M1611 Unilateral primary osteoarthritis, right hip: Secondary | ICD-10-CM | POA: Diagnosis not present

## 2018-02-08 DIAGNOSIS — N183 Chronic kidney disease, stage 3 (moderate): Secondary | ICD-10-CM | POA: Diagnosis not present

## 2018-02-08 DIAGNOSIS — E1122 Type 2 diabetes mellitus with diabetic chronic kidney disease: Secondary | ICD-10-CM | POA: Diagnosis not present

## 2018-02-08 DIAGNOSIS — J019 Acute sinusitis, unspecified: Secondary | ICD-10-CM | POA: Diagnosis not present

## 2018-02-08 DIAGNOSIS — Z96642 Presence of left artificial hip joint: Secondary | ICD-10-CM | POA: Diagnosis not present

## 2018-02-09 DIAGNOSIS — I129 Hypertensive chronic kidney disease with stage 1 through stage 4 chronic kidney disease, or unspecified chronic kidney disease: Secondary | ICD-10-CM | POA: Diagnosis not present

## 2018-02-09 DIAGNOSIS — M1611 Unilateral primary osteoarthritis, right hip: Secondary | ICD-10-CM | POA: Diagnosis not present

## 2018-02-09 DIAGNOSIS — J019 Acute sinusitis, unspecified: Secondary | ICD-10-CM | POA: Diagnosis not present

## 2018-02-09 DIAGNOSIS — E1122 Type 2 diabetes mellitus with diabetic chronic kidney disease: Secondary | ICD-10-CM | POA: Diagnosis not present

## 2018-02-09 DIAGNOSIS — N183 Chronic kidney disease, stage 3 (moderate): Secondary | ICD-10-CM | POA: Diagnosis not present

## 2018-02-09 DIAGNOSIS — Z96642 Presence of left artificial hip joint: Secondary | ICD-10-CM | POA: Diagnosis not present

## 2018-02-10 DIAGNOSIS — Z96642 Presence of left artificial hip joint: Secondary | ICD-10-CM | POA: Diagnosis not present

## 2018-02-10 DIAGNOSIS — I129 Hypertensive chronic kidney disease with stage 1 through stage 4 chronic kidney disease, or unspecified chronic kidney disease: Secondary | ICD-10-CM | POA: Diagnosis not present

## 2018-02-10 DIAGNOSIS — E1122 Type 2 diabetes mellitus with diabetic chronic kidney disease: Secondary | ICD-10-CM | POA: Diagnosis not present

## 2018-02-10 DIAGNOSIS — N183 Chronic kidney disease, stage 3 (moderate): Secondary | ICD-10-CM | POA: Diagnosis not present

## 2018-02-10 DIAGNOSIS — M1611 Unilateral primary osteoarthritis, right hip: Secondary | ICD-10-CM | POA: Diagnosis not present

## 2018-02-10 DIAGNOSIS — J019 Acute sinusitis, unspecified: Secondary | ICD-10-CM | POA: Diagnosis not present

## 2018-02-11 DIAGNOSIS — N183 Chronic kidney disease, stage 3 (moderate): Secondary | ICD-10-CM | POA: Diagnosis not present

## 2018-02-11 DIAGNOSIS — I129 Hypertensive chronic kidney disease with stage 1 through stage 4 chronic kidney disease, or unspecified chronic kidney disease: Secondary | ICD-10-CM | POA: Diagnosis not present

## 2018-02-11 DIAGNOSIS — M1611 Unilateral primary osteoarthritis, right hip: Secondary | ICD-10-CM | POA: Diagnosis not present

## 2018-02-11 DIAGNOSIS — J019 Acute sinusitis, unspecified: Secondary | ICD-10-CM | POA: Diagnosis not present

## 2018-02-11 DIAGNOSIS — E1122 Type 2 diabetes mellitus with diabetic chronic kidney disease: Secondary | ICD-10-CM | POA: Diagnosis not present

## 2018-02-11 DIAGNOSIS — Z96642 Presence of left artificial hip joint: Secondary | ICD-10-CM | POA: Diagnosis not present

## 2018-02-14 DIAGNOSIS — Z96642 Presence of left artificial hip joint: Secondary | ICD-10-CM | POA: Diagnosis not present

## 2018-02-14 DIAGNOSIS — E1122 Type 2 diabetes mellitus with diabetic chronic kidney disease: Secondary | ICD-10-CM | POA: Diagnosis not present

## 2018-02-14 DIAGNOSIS — I129 Hypertensive chronic kidney disease with stage 1 through stage 4 chronic kidney disease, or unspecified chronic kidney disease: Secondary | ICD-10-CM | POA: Diagnosis not present

## 2018-02-14 DIAGNOSIS — N183 Chronic kidney disease, stage 3 (moderate): Secondary | ICD-10-CM | POA: Diagnosis not present

## 2018-02-14 DIAGNOSIS — J019 Acute sinusitis, unspecified: Secondary | ICD-10-CM | POA: Diagnosis not present

## 2018-02-14 DIAGNOSIS — M1611 Unilateral primary osteoarthritis, right hip: Secondary | ICD-10-CM | POA: Diagnosis not present

## 2018-02-16 DIAGNOSIS — J019 Acute sinusitis, unspecified: Secondary | ICD-10-CM | POA: Diagnosis not present

## 2018-02-16 DIAGNOSIS — M1611 Unilateral primary osteoarthritis, right hip: Secondary | ICD-10-CM | POA: Diagnosis not present

## 2018-02-16 DIAGNOSIS — N183 Chronic kidney disease, stage 3 (moderate): Secondary | ICD-10-CM | POA: Diagnosis not present

## 2018-02-16 DIAGNOSIS — I129 Hypertensive chronic kidney disease with stage 1 through stage 4 chronic kidney disease, or unspecified chronic kidney disease: Secondary | ICD-10-CM | POA: Diagnosis not present

## 2018-02-16 DIAGNOSIS — E1122 Type 2 diabetes mellitus with diabetic chronic kidney disease: Secondary | ICD-10-CM | POA: Diagnosis not present

## 2018-02-16 DIAGNOSIS — Z96642 Presence of left artificial hip joint: Secondary | ICD-10-CM | POA: Diagnosis not present

## 2018-02-17 DIAGNOSIS — M1611 Unilateral primary osteoarthritis, right hip: Secondary | ICD-10-CM | POA: Diagnosis not present

## 2018-02-17 DIAGNOSIS — E1122 Type 2 diabetes mellitus with diabetic chronic kidney disease: Secondary | ICD-10-CM | POA: Diagnosis not present

## 2018-02-17 DIAGNOSIS — J019 Acute sinusitis, unspecified: Secondary | ICD-10-CM | POA: Diagnosis not present

## 2018-02-17 DIAGNOSIS — N183 Chronic kidney disease, stage 3 (moderate): Secondary | ICD-10-CM | POA: Diagnosis not present

## 2018-02-17 DIAGNOSIS — I129 Hypertensive chronic kidney disease with stage 1 through stage 4 chronic kidney disease, or unspecified chronic kidney disease: Secondary | ICD-10-CM | POA: Diagnosis not present

## 2018-02-17 DIAGNOSIS — Z96642 Presence of left artificial hip joint: Secondary | ICD-10-CM | POA: Diagnosis not present

## 2018-02-18 DIAGNOSIS — E1122 Type 2 diabetes mellitus with diabetic chronic kidney disease: Secondary | ICD-10-CM | POA: Diagnosis not present

## 2018-02-18 DIAGNOSIS — N183 Chronic kidney disease, stage 3 (moderate): Secondary | ICD-10-CM | POA: Diagnosis not present

## 2018-02-18 DIAGNOSIS — M1611 Unilateral primary osteoarthritis, right hip: Secondary | ICD-10-CM | POA: Diagnosis not present

## 2018-02-18 DIAGNOSIS — I129 Hypertensive chronic kidney disease with stage 1 through stage 4 chronic kidney disease, or unspecified chronic kidney disease: Secondary | ICD-10-CM | POA: Diagnosis not present

## 2018-02-18 DIAGNOSIS — J019 Acute sinusitis, unspecified: Secondary | ICD-10-CM | POA: Diagnosis not present

## 2018-02-18 DIAGNOSIS — Z96642 Presence of left artificial hip joint: Secondary | ICD-10-CM | POA: Diagnosis not present

## 2018-02-22 DIAGNOSIS — M1611 Unilateral primary osteoarthritis, right hip: Secondary | ICD-10-CM | POA: Diagnosis not present

## 2018-02-22 DIAGNOSIS — I129 Hypertensive chronic kidney disease with stage 1 through stage 4 chronic kidney disease, or unspecified chronic kidney disease: Secondary | ICD-10-CM | POA: Diagnosis not present

## 2018-02-22 DIAGNOSIS — E1122 Type 2 diabetes mellitus with diabetic chronic kidney disease: Secondary | ICD-10-CM | POA: Diagnosis not present

## 2018-02-22 DIAGNOSIS — Z96642 Presence of left artificial hip joint: Secondary | ICD-10-CM | POA: Diagnosis not present

## 2018-02-22 DIAGNOSIS — N183 Chronic kidney disease, stage 3 (moderate): Secondary | ICD-10-CM | POA: Diagnosis not present

## 2018-02-22 DIAGNOSIS — J019 Acute sinusitis, unspecified: Secondary | ICD-10-CM | POA: Diagnosis not present

## 2018-02-24 DIAGNOSIS — I129 Hypertensive chronic kidney disease with stage 1 through stage 4 chronic kidney disease, or unspecified chronic kidney disease: Secondary | ICD-10-CM | POA: Diagnosis not present

## 2018-02-24 DIAGNOSIS — E1122 Type 2 diabetes mellitus with diabetic chronic kidney disease: Secondary | ICD-10-CM | POA: Diagnosis not present

## 2018-02-24 DIAGNOSIS — N183 Chronic kidney disease, stage 3 (moderate): Secondary | ICD-10-CM | POA: Diagnosis not present

## 2018-02-24 DIAGNOSIS — M1611 Unilateral primary osteoarthritis, right hip: Secondary | ICD-10-CM | POA: Diagnosis not present

## 2018-02-24 DIAGNOSIS — Z96642 Presence of left artificial hip joint: Secondary | ICD-10-CM | POA: Diagnosis not present

## 2018-02-24 DIAGNOSIS — J019 Acute sinusitis, unspecified: Secondary | ICD-10-CM | POA: Diagnosis not present

## 2018-02-25 DIAGNOSIS — N183 Chronic kidney disease, stage 3 (moderate): Secondary | ICD-10-CM | POA: Diagnosis not present

## 2018-02-25 DIAGNOSIS — J019 Acute sinusitis, unspecified: Secondary | ICD-10-CM | POA: Diagnosis not present

## 2018-02-25 DIAGNOSIS — M1611 Unilateral primary osteoarthritis, right hip: Secondary | ICD-10-CM | POA: Diagnosis not present

## 2018-02-25 DIAGNOSIS — Z96642 Presence of left artificial hip joint: Secondary | ICD-10-CM | POA: Diagnosis not present

## 2018-02-25 DIAGNOSIS — E1122 Type 2 diabetes mellitus with diabetic chronic kidney disease: Secondary | ICD-10-CM | POA: Diagnosis not present

## 2018-02-25 DIAGNOSIS — I129 Hypertensive chronic kidney disease with stage 1 through stage 4 chronic kidney disease, or unspecified chronic kidney disease: Secondary | ICD-10-CM | POA: Diagnosis not present

## 2018-03-02 DIAGNOSIS — E1159 Type 2 diabetes mellitus with other circulatory complications: Secondary | ICD-10-CM | POA: Diagnosis not present

## 2018-03-02 DIAGNOSIS — I739 Peripheral vascular disease, unspecified: Secondary | ICD-10-CM | POA: Diagnosis not present

## 2018-03-02 DIAGNOSIS — L608 Other nail disorders: Secondary | ICD-10-CM | POA: Diagnosis not present

## 2018-03-02 DIAGNOSIS — B351 Tinea unguium: Secondary | ICD-10-CM | POA: Diagnosis not present

## 2018-03-18 ENCOUNTER — Telehealth: Payer: Self-pay | Admitting: Internal Medicine

## 2018-03-18 NOTE — Telephone Encounter (Signed)
I called the patient to schedule AWV with [facility]ke[facility].  There was no answer and no option to leave a message. VDM (DD) °

## 2018-03-22 ENCOUNTER — Telehealth: Payer: Self-pay | Admitting: Internal Medicine

## 2018-03-22 NOTE — Telephone Encounter (Signed)
2nd attempt to reach pt to schedule AWV w/ Nickeah. There was no answer and no option to leave a message. VDM (DD)

## 2018-04-12 ENCOUNTER — Telehealth: Payer: Self-pay | Admitting: Podiatry

## 2018-04-12 NOTE — Telephone Encounter (Signed)
Pt would like some information from his previous visits. [place]. Please call, when you get a chance.

## 2018-04-26 DIAGNOSIS — I10 Essential (primary) hypertension: Secondary | ICD-10-CM | POA: Diagnosis not present

## 2018-04-26 DIAGNOSIS — E119 Type 2 diabetes mellitus without complications: Secondary | ICD-10-CM | POA: Diagnosis not present

## 2018-04-26 DIAGNOSIS — N183 Chronic kidney disease, stage 3 (moderate): Secondary | ICD-10-CM | POA: Diagnosis not present

## 2018-04-26 DIAGNOSIS — J3489 Other specified disorders of nose and nasal sinuses: Secondary | ICD-10-CM | POA: Diagnosis not present

## 2018-04-26 DIAGNOSIS — Z23 Encounter for immunization: Secondary | ICD-10-CM | POA: Diagnosis not present

## 2018-05-03 DIAGNOSIS — L608 Other nail disorders: Secondary | ICD-10-CM | POA: Diagnosis not present

## 2018-05-03 DIAGNOSIS — M79675 Pain in left toe(s): Secondary | ICD-10-CM | POA: Diagnosis not present

## 2018-05-03 DIAGNOSIS — E1142 Type 2 diabetes mellitus with diabetic polyneuropathy: Secondary | ICD-10-CM | POA: Diagnosis not present

## 2018-05-03 DIAGNOSIS — M79674 Pain in right toe(s): Secondary | ICD-10-CM | POA: Diagnosis not present

## 2018-05-24 DIAGNOSIS — W19XXXA Unspecified fall, initial encounter: Secondary | ICD-10-CM | POA: Diagnosis not present

## 2018-05-24 DIAGNOSIS — I10 Essential (primary) hypertension: Secondary | ICD-10-CM | POA: Diagnosis not present

## 2018-05-24 DIAGNOSIS — T1490XA Injury, unspecified, initial encounter: Secondary | ICD-10-CM | POA: Diagnosis not present

## 2018-05-24 DIAGNOSIS — S0181XA Laceration without foreign body of other part of head, initial encounter: Secondary | ICD-10-CM | POA: Diagnosis not present

## 2018-05-24 DIAGNOSIS — S199XXA Unspecified injury of neck, initial encounter: Secondary | ICD-10-CM | POA: Diagnosis not present

## 2018-05-24 DIAGNOSIS — X58XXXA Exposure to other specified factors, initial encounter: Secondary | ICD-10-CM | POA: Diagnosis not present

## 2018-05-24 DIAGNOSIS — S01112A Laceration without foreign body of left eyelid and periocular area, initial encounter: Secondary | ICD-10-CM | POA: Diagnosis not present

## 2018-06-13 ENCOUNTER — Telehealth: Payer: Self-pay | Admitting: Internal Medicine

## 2018-06-13 NOTE — Telephone Encounter (Signed)
Opened encounter by mistake. lec

## 2018-06-13 NOTE — Telephone Encounter (Signed)
Called patient to schedule AWV-S.  He states is living in [place] now.  Asked question about the MAP program and if he could get it in [place]. Advised pt to call the office at [phone]. lec

## 2018-06-15 ENCOUNTER — Telehealth: Payer: Self-pay

## 2018-06-15 NOTE — Telephone Encounter (Signed)
The pt was given the number to the [facility] program in [place] and was advised to ask if it's a program where he is currently living.

## 2022-04-08 DEATH — deceased
